# Patient Record
Sex: Female | Born: 1995 | Race: Black or African American | Hispanic: No | Marital: Single | Smoking: Never smoker
Health system: Southern US, Community
[De-identification: ages and names within clinical notes are randomized; demographics above are authoritative.]

## PROBLEM LIST (undated history)

## (undated) DIAGNOSIS — R519 Headache, unspecified: Secondary | ICD-10-CM

## (undated) DIAGNOSIS — R569 Unspecified convulsions: Secondary | ICD-10-CM

## (undated) DIAGNOSIS — R51 Headache: Secondary | ICD-10-CM

## (undated) HISTORY — DX: Headache: R51

## (undated) HISTORY — DX: Headache, unspecified: R51.9

---
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
## 2017-02-05 ENCOUNTER — Ambulatory Visit (INDEPENDENT_AMBULATORY_CARE_PROVIDER_SITE_OTHER): Payer: No Typology Code available for payment source | Admitting: Pediatrics

## 2017-02-19 IMAGING — DX DG FINGER LITTLE 2+V*R*
3 series · 3 of 3 positions shown · non-contrast
Comparison: None.

CLINICAL DATA: 19-year-old female with fall and trauma to the right
fifth digit.

EXAM:
RIGHT LITTLE FINGER 2+V

[finger ap]
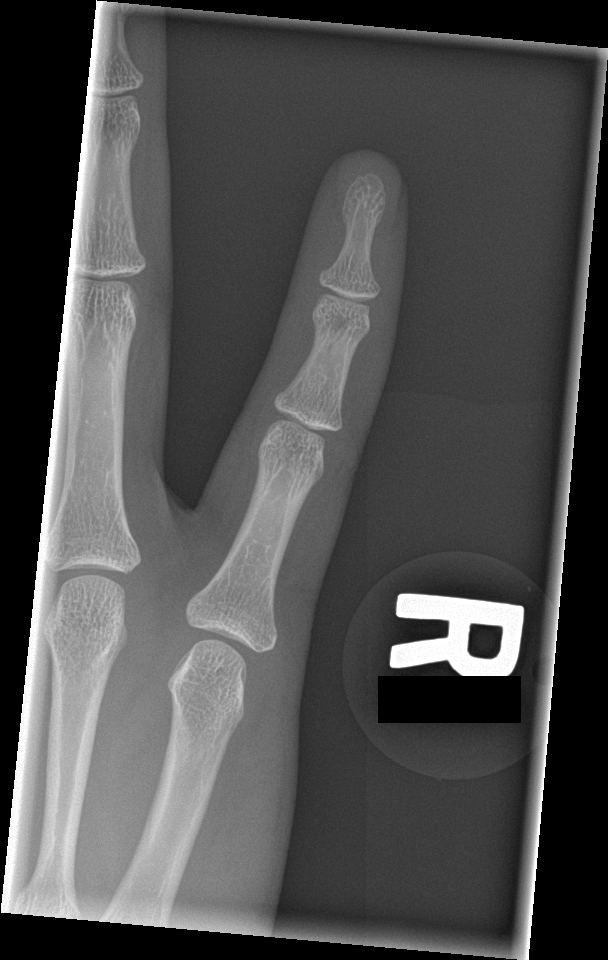

[finger obl]
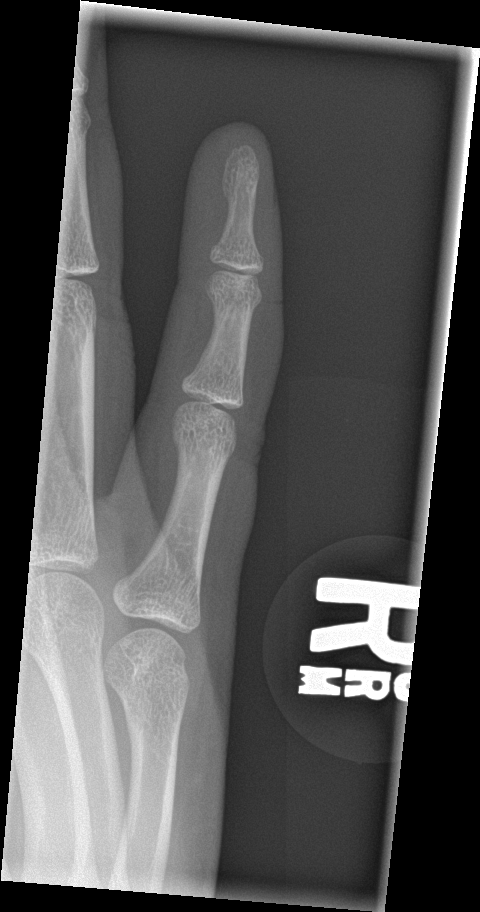

[finger lat]
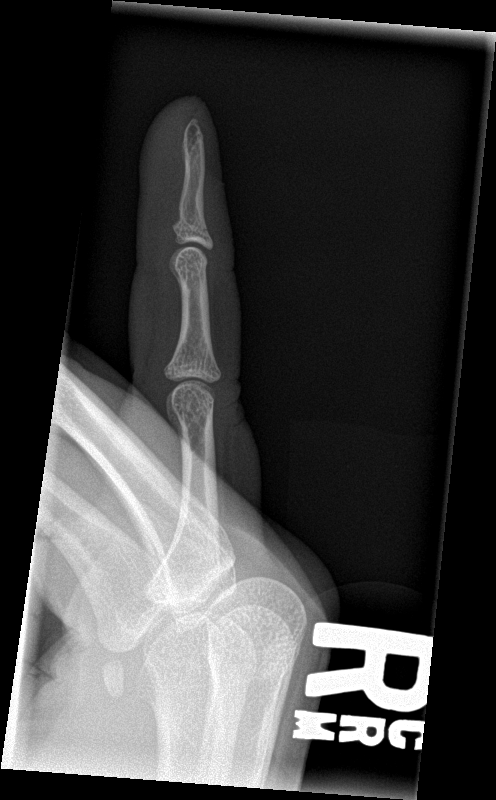

[3 of 3 positions shown; findings below may reference images not displayed]

FINDINGS: There is no acute fracture or dislocation. The bones are well
mineralized. There is mild diffuse soft tissue swelling of the
digit. No radiopaque foreign object.
IMPRESSION: No acute osseous pathology.

## 2017-07-09 ENCOUNTER — Encounter (HOSPITAL_COMMUNITY): Payer: Self-pay | Admitting: Family Medicine

## 2017-07-09 ENCOUNTER — Ambulatory Visit (HOSPITAL_COMMUNITY)
Admission: EM | Admit: 2017-07-09 | Discharge: 2017-07-09 | Disposition: A | Discharge status: Home / Self Care | Payer: 59 | Attending: Family Medicine | Admitting: Family Medicine

## 2017-07-09 DIAGNOSIS — Z820 Family history of epilepsy and other diseases of the nervous system: Secondary | ICD-10-CM | POA: Diagnosis not present

## 2017-07-09 DIAGNOSIS — J029 Acute pharyngitis, unspecified: Secondary | ICD-10-CM

## 2017-07-09 DIAGNOSIS — G43109 Migraine with aura, not intractable, without status migrainosus: Secondary | ICD-10-CM | POA: Insufficient documentation

## 2017-07-09 DIAGNOSIS — G40409 Other generalized epilepsy and epileptic syndromes, not intractable, without status epilepticus: Secondary | ICD-10-CM | POA: Insufficient documentation

## 2017-07-09 DIAGNOSIS — Z8249 Family history of ischemic heart disease and other diseases of the circulatory system: Secondary | ICD-10-CM | POA: Insufficient documentation

## 2017-07-09 DIAGNOSIS — Z888 Allergy status to other drugs, medicaments and biological substances status: Secondary | ICD-10-CM | POA: Insufficient documentation

## 2017-07-09 LAB — POCT RAPID STREP A: Streptococcus, Group A Screen (Direct): NEGATIVE

## 2017-07-09 MED ORDER — IBUPROFEN 800 MG PO TABS
800.0000 mg | ORAL_TABLET | Freq: Once | ORAL | Status: AC
  Administered 2017-07-09: 800 mg via ORAL

## 2017-07-09 MED ORDER — IBUPROFEN 800 MG PO TABS
ORAL_TABLET | ORAL | Status: AC
  Filled 2017-07-09: qty 1

## 2017-07-09 NOTE — Discharge Instructions (Signed)
Push fluids to ensure adequate hydration and keep secretions thin.  Tylenol and/or ibuprofen as needed for pain or fevers.  We have sent your throat swab to be cultured and will call if this returns positive or any changes to treatment are indicated.  If symptoms worsen or do not improve in the next week to return to be seen or to follow up with your PCP.

## 2017-07-09 NOTE — ED Triage Notes (Signed)
Pt here for sore throat x 3 days  

## 2017-07-09 NOTE — ED Provider Notes (Signed)
[facility]    CSN: [id number] Arrival date & time: 07/09/17  1012     History   Chief Complaint Chief Complaint  Patient presents with  . Sore Throat    HPI Olivia Castillo is a 22 y.o. female.   [name] presents with her mother with complaints of sore throat which started three days ago and feels to be worsening. Denies headache, abdominal pain, congestion, ear pain, rash or known ill contacts. Has had slight cough. Took ibuprofen yesterday which did help some. Hx headache and seizures, does not take any medications regularly.   ROS per HPI.      Past Medical History:  Diagnosis Date  . Headache   . Seizure [facility])     Patient Active Problem List   Diagnosis Date Noted  . Intermittent explosive disorder in adult 10/28/2016  . Performance anxiety 10/28/2016  . 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 Localization-related (focal) (partial) epilepsy and epileptic syndromes with complex partial seizures, without mention of intractable epilepsy 10/13/2012  . Episodic tension type headache 10/13/2012  . Migraine without aura 10/13/2012  . Migraine with aura, without mention of intractable migraine without mention of status migrainosus 10/13/2012  . Dysfunctions associated with sleep stages or arousal from sleep 10/13/2012  . Headache(784.0) 10/13/2012    History reviewed. No pertinent surgical history.  OB History   None      Home Medications    Prior to Admission medications   Not on File    Family History Family History  Problem Relation Age of Onset  . Migraines Mother   . Migraines Maternal Grandmother   . Heart disease Maternal Grandfather   . Hypertension Maternal Grandfather   . Hypertension Maternal Aunt   . Migraines Maternal Uncle   . Seizures Other        MGA  . ADD / ADHD Cousin        Maternal 1 st cousin    Social History Social History   Tobacco Use  . Smoking status: Never  Smoker  . Smokeless tobacco: Never Used  Substance Use Topics  . Alcohol use: No  . Drug use: No     Allergies   Oseltamivir phosphate   Review of Systems Review of Systems   Physical Exam Triage Vital Signs ED Triage Vitals [07/09/17 1056]  Enc Vitals Group     BP (!) 105/50     Pulse Rate 98     Resp 18     Temp 99.4 F (37.4 C)     Temp src      SpO2 100 %     Weight      Height      Head Circumference      Peak Flow      Pain Score      Pain Loc      Pain Edu?      Excl. in GC?    No data found.  Updated Vital Signs BP (!) 105/50   Pulse 98   Temp 99.4 F (37.4 C)   Resp 18   LMP 06/26/2017   SpO2 100%    Physical Exam  Constitutional: She is oriented to person, place, and time. She appears well-developed and well-nourished. No distress.  HENT:  Head: Normocephalic and atraumatic.  Right Ear: Tympanic membrane, external ear and ear canal normal.  Left Ear: Tympanic membrane, external ear and ear canal normal.  Nose: Nose normal.  Mouth/Throat: Uvula  is midline, oropharynx is clear and moist and mucous membranes are normal. Tonsils are 1+ on the right. Tonsils are 1+ on the left. Tonsillar exudate.  Eyes: Pupils are equal, round, and reactive to light. Conjunctivae and EOM are normal.  Cardiovascular: Normal rate, regular rhythm and normal heart sounds.  Pulmonary/Chest: Effort normal and breath sounds normal.  Lymphadenopathy:    She has no cervical adenopathy.  Neurological: She is alert and oriented to person, place, and time.  Skin: Skin is warm and dry.     UC Treatments / Results  Labs (all labs ordered are listed, but only abnormal results are displayed) Labs Reviewed  CULTURE, GROUP [facility] STREP [facility])  POCT RAPID STREP A    EKG None  Radiology No results found.  Procedures Procedures (including critical care time)  Medications Ordered in UC Medications  ibuprofen (ADVIL,MOTRIN) tablet 800 mg (has no administration in time  range)    Initial Impression / Assessment and Plan / UC Course  I have reviewed the triage vital signs and the nursing notes.  Pertinent labs & imaging results that were available during my care of the patient were reviewed by me and considered in my medical decision making (see chart for details).     Afebrile. Tonsils with some exudate noted. Negative rapid strep. Likely viral. Throat culture pending. Supportive cares recommended. Patient verbalized understanding and agreeable to plan.    Final Clinical Impressions(s) / UC Diagnoses   Final diagnoses:  Pharyngitis, unspecified etiology     Discharge Instructions     Push fluids to ensure adequate hydration and keep secretions thin.  Tylenol and/or ibuprofen as needed for pain or fevers.  We have sent your throat swab to be cultured and will call if this returns positive or any changes to treatment are indicated.  If symptoms worsen or do not improve in the next week to return to be seen or to follow up with your PCP.     ED Prescriptions    None     Controlled Substance Prescriptions [place] Controlled Substance Registry consulted? Not Applicable   Georgetta Haber, NP 07/09/17 1129

## 2017-07-11 LAB — CULTURE, GROUP A STREP (THRC)

## 2017-10-28 ENCOUNTER — Ambulatory Visit (INDEPENDENT_AMBULATORY_CARE_PROVIDER_SITE_OTHER): Payer: 59 | Admitting: Family

## 2017-10-28 ENCOUNTER — Encounter (INDEPENDENT_AMBULATORY_CARE_PROVIDER_SITE_OTHER): Payer: Self-pay | Admitting: Family

## 2017-10-28 VITALS — BP 100/70 | HR 68 | Ht 63.0 in | Wt 207.8 lb

## 2017-10-28 DIAGNOSIS — G44219 Episodic tension-type headache, not intractable: Secondary | ICD-10-CM | POA: Diagnosis not present

## 2017-10-28 DIAGNOSIS — G40909 Epilepsy, unspecified, not intractable, without status epilepticus: Secondary | ICD-10-CM

## 2017-10-28 DIAGNOSIS — G43009 Migraine without aura, not intractable, without status migrainosus: Secondary | ICD-10-CM | POA: Diagnosis not present

## 2017-10-28 DIAGNOSIS — R569 Unspecified convulsions: Secondary | ICD-10-CM

## 2017-10-28 NOTE — Patient Instructions (Signed)
Thank you for coming in today.   Instructions for you until your next appointment are as follows:  1. I have scheduled a sleep deprived EEG for you for Friday October 30, 2017. Please arrive at [facility] at 7:45AM to check in.  2. To prepare for the sleep deprived EEG, you can nap in the evening until 10pm. Then stay awake for the remainder of the night until your EEG. You can eat and drink caffeine free liquids, watch TV etc but it is important for you to remain awake until the EEG has been done.  3. You will need a driver to take you to and from the EEG appointment.  4. I will call you after I receive the EEG results.  5. It will be a good idea for a monitor to be placed in your room so your family can hear you if you have a seizure during sleep.  6. Do not drive until you have been released to do so by this office.  7. We will make a plan for your follow up based on the EEG results. I will talk with you about that when I call you with the results.  8. Please sign up for MyChart if you have not done so

## 2017-10-28 NOTE — Progress Notes (Signed)
Patient: Olivia Castillo MRN: [id number] Sex: female DOB: 1995/03/06  Provider: Elveria Rising, NP Location of Care: [facility]  Note type: Routine return visit  History of Present Illness: Referral Source: Dr. Roma Schanz History from: mother, patient and [facility] chart Chief Complaint: Headaches  Olivia Castillo is a 22 y.o. young woman with history of headaches and seizures. She was last seen by Dr Sharene Skeans on October 28, 2016. Olivia Castillo and her mother are concerned today because she has had 2 suspected seizure events in the last few months. The first occurred in July while staying overnight with a friend at college. She said that she awakened confused, with urinary incontinence and a bitten tongue. Then next occurred last weekend, and she awakened in a similar fashion, confused, with urinary incontinence and bitten tongue as well as scratches and bruising to her left upper arm, bruising to the right arm and very sore legs. She and her mother say that she had a normal day prior to these events and that she had not been sleep deprived. They also report some events in which Olivia Castillo would go to her mother in the mornings with large bruises on her arms and legs with no memory of how they occurred. Olivia Castillo had seizures that began in 2012 and she successfully tapered off Lamotrigine in 2014.  Olivia Castillo reports intermittent headaches, some of which are severe. She estimates one headache per week, but says that not all require treatment. When she has a bad headache, Ibuprofen 600mg  and a nap usually gives her relief. She denies any symptoms with the headaches other than holocephalic pain.   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 She and her mother tell me today that she is taking time off from school and working as a Geologist, engineering in a store. She usually works evenings but does not work overnight. Olivia Castillo denies skipped meals  and says that she has been working on a healthy eating plan and exercise program. She says that she drinks plenty of water as part of that plan.   Olivia Castillo has been otherwise healthy since she was last seen. Neither she nor her mother have other health concerns for her today other than previously mentioned.  Review of Systems: Please see the HPI for neurologic and other pertinent review of systems. Otherwise, all other systems were reviewed and were negative.    Past Medical History:  Diagnosis Date  . Headache   . Seizure (HCC)    Hospitalizations: No., Head Injury: No., Nervous System Infections: No., Immunizations up to date: Yes.   Past Medical History Comments: The patient was evaluated October 09, 2005 for headaches and insomnia. I concluded that she had migraine without aura and tension-type headaches. 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 She went to bed quite late and slept soundly for long periods of time. I recommended melatonin and possibly clonidine as well assleep hygiene.  I saw her again January 29, 2009 with history of headaches twice a week. She came home early on 2 occasions. I noted positive family history of migraines, a normal neurological examination, and a diagnosis of migraine without aura, migraine with aura and episodic tension type headaches. Mother mentioned that the patient had headaches and lost vision at times when her hair was being combed, often without changing position or elevating her head. These episodes were not associated with syncope because she did not  lose consciousness nor fall.  She had onset of nocturnal seizures in February 10, 2011. This may have been preceded by an episode of twisting of her mouth associated clot laughter during an episode of flu in late November, 2012. CT scan of the brain February 05, 2011 was normal without contrast. EEG February 11, 2011 was normal with the patient awake and drowsy.  Routine  EEG in December 2013 was normal.  Patient was hospitalized due to seizure activity in 2013.  A 48-hour ambulatory EEG showed 12 electrographic seizures lasting 45 to 102 seconds with a central 6 hertz theta range activity that localized to either side with secondary generalization and a generalized discharge to 20 hertz beta range activity shifting to 6 hertz generalized theta range activity followed by delta. Shifting frequencies convinced me that this was seizure activity as opposed to a rhythmic mid-temporal discharge.   She had problems with bedwetting between ages 9 and 59.  Birth History 7 lbs. 3 oz. infant born at term to a 22 year old gravida 3 para 2000 woman. Gestation was complicated by spotting mother recalls having "some heart trouble". She can't recall the details. She had excessive nausea during the first trimester and took an antiemetic. Labor lasted for 23 hours mother received epidural anesthesia. Normal spontaneous vaginal delivery. Delivery room was uncomplicated except for suctioning within her mouth. She did not require resuscitation.  Nursery course was uneventful. She went home with her mother.  Growth and development is recorded on the chart is normal.  Behavior History intermittent explosive anger, performance anxiety; as a child she would become upset easily which continued into her teen years  Surgical History History reviewed. No pertinent surgical history.  Family History family history includes ADD / ADHD in her cousin; Heart disease in her maternal grandfather; Hypertension in her maternal aunt and maternal grandfather; Migraines in her maternal grandmother, maternal uncle, and mother; Seizures in her other. Family History is otherwise negative for migraines, seizures, cognitive impairment, blindness, deafness, birth defects, chromosomal disorder, autism.  Social History Social History   Socioeconomic History  . Marital status: Single    Spouse name:  Not on file  . Number of children: Not on file  . Years of education: Not on file  . Highest education level: Not on file  Occupational History  . Not on file  Social Needs  . Financial resource strain: Not on file  . Food insecurity:    Worry: Not on file    Inability: Not on file  . Transportation needs:    Medical: Not on file    Non-medical: Not on file  Tobacco Use  . Smoking status: Never Smoker  . Smokeless tobacco: Never Used  Substance and Sexual Activity  . Alcohol use: No  . Drug use: No  . Sexual activity: Never  Lifestyle  . Physical activity:    Days per week: Not on file    Minutes per session: Not on file  . Stress: Not on file  Relationships  . Social connections:    Talks on phone: Not on file    Gets together: Not on file    Attends religious service: Not on file    Active member of club or organization: Not on file    Attends meetings of clubs or organizations: Not on file    Relationship status: Not on file  Other Topics Concern  . 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  She attended [employer].   She lives with both parents. She has two older sisters.   She enjoys chilling, shopping, and hanging with friends.   She works at [employer] in [employer].    Allergies Allergies  Allergen Reactions  . Oseltamivir Phosphate Other (See Comments)    Family relates connection between tamiflu and seizures    Physical Exam BP 100/70   Pulse 68   Ht 5\' 3"  (1.6 m)   Wt 207 lb 12.8 oz (94.3 kg)   BMI 36.81 kg/m  General: Well developed, well nourished, seated, in no evident distress, black hair, brown eyes, right handed Head: Head normocephalic and atraumatic.  Oropharynx benign. Neck: Supple with no carotid bruits Cardiovascular: Regular rate and rhythm, no murmurs Respiratory: Breath sounds clear to auscultation Musculoskeletal: No obvious deformities or scoliosis Skin: No rashes or neurocutaneous  lesions  Neurologic Exam Mental Status: Awake and fully alert.  Oriented to place and time.  Recent and remote memory intact.  Attention span, concentration, and fund of knowledge appropriate.  Mood and affect appropriate. Cranial Nerves: Fundoscopic exam reveals sharp disc margins.  Pupils equal, briskly reactive to light.  Extraocular movements full without nystagmus.  Visual fields full to confrontation.  Hearing intact and symmetric to finger rub.  Facial sensation intact.  Face tongue, palate move normally and symmetrically.  Neck flexion and extension normal. Motor: Normal bulk and tone. Normal strength in all tested extremity muscles. Sensory: Intact to touch and temperature in all extremities.  Coordination: Rapid alternating movements normal in all extremities.  Finger-to-nose and heel-to shin performed accurately bilaterally.  Romberg negative. Gait and Station: Arises from chair without difficulty.  Stance is normal. Gait demonstrates normal stride length and balance.   Able to heel, toe and tandem walk without difficulty. Reflexes: 1+ and symmetric. Toes downgoing.  Impression 1. Nocturnal seizures 2. Migraine without aura 3. Episodic tension headache   Recommendations for plan of care The patient's previous [facility] records were reviewed. Olivia Castillo has neither had nor required imaging or lab studies since the last visit. She is a 23 year old young woman with history of headaches and seizures. She successfully tapered off Lamotrigine in 2014, after a diagnosis of seizures in 2012. Unfortunately she had an event in July and one last weekend that are suspicious for seizures, as well as some times of waking with unexplained bruising. I recommended a sleep deprived EEG, which will be performed on October 30, 2017. I reviewed instructions for the sleep deprived EEG with Farin and her mother. I will call Tahiry and her mother after the results are available and will talk with them about a  treatment plan at that time. I instructed her not to drive until released to do so by this office. I recommended use of a monitor in her room so that her family will be able to hear her if a seizure occurs. Dacia and her mother agreed with the plans made today.   The medication list was reviewed and reconciled.  No changes were made in the prescribed medications today.  A complete medication list was provided to the patient.  Allergies as of 10/28/2017      Reactions   Oseltamivir Phosphate Other (See Comments)   Family relates connection between tamiflu and seizures      Medication List    as of 10/28/2017  2:45 PM   You have not been prescribed any medications.     Dr. Sharene Skeans was consulted regarding  the patient.   Total time spent with the patient was 25 minutes, of which 50% or more was spent in counseling and coordination of care.   Elveria Rising NP-C

## 2017-10-30 ENCOUNTER — Ambulatory Visit (HOSPITAL_COMMUNITY)
Admission: RE | Admit: 2017-10-30 | Discharge: 2017-10-30 | Disposition: A | Discharge status: Home / Self Care | Payer: 59 | Source: Ambulatory Visit | Attending: Family | Admitting: Family

## 2017-10-30 ENCOUNTER — Telehealth (INDEPENDENT_AMBULATORY_CARE_PROVIDER_SITE_OTHER): Payer: Self-pay | Admitting: Family

## 2017-10-30 DIAGNOSIS — R569 Unspecified convulsions: Secondary | ICD-10-CM

## 2017-10-30 DIAGNOSIS — Z79899 Other long term (current) drug therapy: Secondary | ICD-10-CM

## 2017-10-30 DIAGNOSIS — G40909 Epilepsy, unspecified, not intractable, without status epilepticus: Secondary | ICD-10-CM | POA: Diagnosis present

## 2017-10-30 NOTE — Progress Notes (Signed)
EEG complete. Results pending.  ?

## 2017-10-30 NOTE — Telephone Encounter (Signed)
I called the patient and reached an unidentified voicemail. I left her a message saying that I will call her on Monday. At that time I will review the EEG results and Dr Hickling's recommendation for her to get labs drawn and start Lamotrigine. TG

## 2017-10-31 NOTE — Procedures (Signed)
Patient: Olivia Castillo MRN: [id number] Sex: female DOB: [birth year]  Clinical History: Olivia Castillo is a 22 y.o. with recurrent nocturnal seizures.  These began December, 2012.  Initial EEG is in December 2012 and December 2013 were normal for the patient was hospitalized related to seizure activity.  Prolonged end of her EEG showed 12 electrographic seizures lasting 45 to 102 seconds.  She was placed on lamotrigine which controlled seizure behavior and it was discontinued in 2014.  She had recurrent seizures in July and late August associated with urinary incontinence and tongue biting.  The study is performed to look for the presence of seizure activity.  Medications: none  Procedure: The tracing is carried out on a 32-channel digital Cadwell recorder, reformatted into 16-channel montages with 1 devoted to EKG.  The patient was awake, drowsy and asleep during the recording.  The international 10/20 system lead placement used.  Recording time 45.4 minutes.   Description of Findings: Dominant frequency is 20 V, 10 hz, alpha range activity that is well modulated and well regulated that was broadly and symmetrically distributed and attenuates with eye-opening.    Background activity consists of mixed frequency alpha and beta range activity with occasional upper theta range component superimposed and a well-defined 8 Hz central rhythm.  Frontal eye movement artifact was seen.  Patient becomes drowsy and drifts into natural sleep with generalized delta range activity vertex sharp waves and symmetric and synchronous sleep spindles.  There was no interictal epileptiform activity in the form of spikes or sharp waves..  Activating procedures included intermittent photic stimulation, and hyperventilation.  Intermittent photic stimulation failed to induce a driving response.  Hyperventilation caused no significant change in background.  EKG showed a regular sinus rhythm with a ventricular response of 96  beats per minute.  Impression: This is a normal record with the patient awake, drowsy and asleep.  A normal EEG does not rule out the presence of seizures.  Ellison Carwin, MD

## 2017-11-02 ENCOUNTER — Other Ambulatory Visit (INDEPENDENT_AMBULATORY_CARE_PROVIDER_SITE_OTHER): Payer: Self-pay | Admitting: Family

## 2017-11-02 ENCOUNTER — Telehealth (INDEPENDENT_AMBULATORY_CARE_PROVIDER_SITE_OTHER): Payer: Self-pay | Admitting: Family

## 2017-11-02 DIAGNOSIS — R569 Unspecified convulsions: Secondary | ICD-10-CM

## 2017-11-02 LAB — CBC WITH DIFFERENTIAL/PLATELET
BASOS PCT: 1 %
Basophils Absolute: 42 cells/uL (ref 0–200)
EOS PCT: 1 %
Eosinophils Absolute: 42 cells/uL (ref 15–500)
HCT: 33.1 % — ABNORMAL LOW (ref 35.0–45.0)
Hemoglobin: 10 g/dL — ABNORMAL LOW (ref 11.7–15.5)
Lymphs Abs: 1760 cells/uL (ref 850–3900)
MCH: 22 pg — ABNORMAL LOW (ref 27.0–33.0)
MCHC: 30.2 g/dL — ABNORMAL LOW (ref 32.0–36.0)
MCV: 72.9 fL — ABNORMAL LOW (ref 80.0–100.0)
MONOS PCT: 9.5 %
MPV: 9.7 fL (ref 7.5–12.5)
NEUTROS PCT: 46.6 %
Neutro Abs: 1957 cells/uL (ref 1500–7800)
PLATELETS: 426 10*3/uL — AB (ref 140–400)
RBC: 4.54 10*6/uL (ref 3.80–5.10)
RDW: 14.7 % (ref 11.0–15.0)
TOTAL LYMPHOCYTE: 41.9 %
WBC: 4.2 10*3/uL (ref 3.8–10.8)
WBCMIX: 399 {cells}/uL (ref 200–950)

## 2017-11-02 LAB — ALT: ALT: 12 U/L (ref 5–32)

## 2017-11-02 MED ORDER — LAMOTRIGINE 25 MG PO TABS: ORAL_TABLET | ORAL | 0 refills | Status: DC

## 2017-11-02 NOTE — Telephone Encounter (Signed)
°  Who's calling (name and relationship to patient) : Trudah, mother (DPR on file)  Best contact number: [phone]  Provider they see: Elveria Rising, NP  Reason for call: Stated they picked up the lamotrigine rx today from [facility] on [address] Would like to know why the amount was written for 84 pills.     PRESCRIPTION REFILL ONLY  Name of prescription:  Pharmacy:

## 2017-11-02 NOTE — Telephone Encounter (Signed)
I called and talked with Olivia Castillo's mother. I explained to her that the EEG was read as normal but because of her recent seziures, Dr Sharene Skeans recommends starting Lamotrigine as she had seizure control with it in the past. I also explained that she will need a blood draw to check CBC and ALT prior to starting the medication, in 2 weeks, 4 weeks and 6 weeks. I sent in the order for the first 4 weeks of medication and the lab order. I will mail a letter to the patient with this information. I will see Olivia Castillo again in 6 weeks. Mom agreed with the plans made today. TG

## 2017-11-02 NOTE — Telephone Encounter (Signed)
I called and left a message to explain that the 84 tablets is for the first 4 weeks of therapy. On week #5, I will send in a new Rx for 100mg  tablets. I will call Mom again later to talk with her. TG

## 2017-11-02 NOTE — Telephone Encounter (Signed)
Did you want to change this before I send in the refill

## 2017-11-02 NOTE — Telephone Encounter (Signed)
I called and explained to Mom about the medication. She said that when Olivia Castillo took it before, that she did not increase past 50mg  twice per day and did well. I told Mom that may happen again this time but that we will need to wait and see. I will refill her medication as needed when I call her later on about lab results. Mom agreed with this plan. TG

## 2017-11-03 ENCOUNTER — Other Ambulatory Visit (INDEPENDENT_AMBULATORY_CARE_PROVIDER_SITE_OTHER): Payer: Self-pay | Admitting: Family

## 2017-11-03 DIAGNOSIS — R569 Unspecified convulsions: Secondary | ICD-10-CM

## 2017-11-03 DIAGNOSIS — D649 Anemia, unspecified: Secondary | ICD-10-CM

## 2017-11-03 DIAGNOSIS — Z79899 Other long term (current) drug therapy: Secondary | ICD-10-CM

## 2017-11-03 NOTE — Telephone Encounter (Signed)
I agree with your assessment and plan 

## 2017-11-04 ENCOUNTER — Other Ambulatory Visit (INDEPENDENT_AMBULATORY_CARE_PROVIDER_SITE_OTHER): Payer: Self-pay | Admitting: Family

## 2017-11-04 DIAGNOSIS — R569 Unspecified convulsions: Secondary | ICD-10-CM

## 2017-11-27 LAB — CBC WITH DIFFERENTIAL/PLATELET
BASOS PCT: 1 %
Basophils Absolute: 40 cells/uL (ref 0–200)
Eosinophils Absolute: 20 cells/uL (ref 15–500)
Eosinophils Relative: 0.5 %
HEMATOCRIT: 34.7 % — AB (ref 35.0–45.0)
Hemoglobin: 10.9 g/dL — ABNORMAL LOW (ref 11.7–15.5)
LYMPHS ABS: 1500 {cells}/uL (ref 850–3900)
MCH: 23.1 pg — ABNORMAL LOW (ref 27.0–33.0)
MCHC: 31.4 g/dL — ABNORMAL LOW (ref 32.0–36.0)
MCV: 73.7 fL — AB (ref 80.0–100.0)
MPV: 10.1 fL (ref 7.5–12.5)
Monocytes Relative: 9.1 %
Neutro Abs: 2076 cells/uL (ref 1500–7800)
Neutrophils Relative %: 51.9 %
Platelets: 454 10*3/uL — ABNORMAL HIGH (ref 140–400)
RBC: 4.71 10*6/uL (ref 3.80–5.10)
RDW: 16 % — ABNORMAL HIGH (ref 11.0–15.0)
Total Lymphocyte: 37.5 %
WBC mixed population: 364 cells/uL (ref 200–950)
WBC: 4 10*3/uL (ref 3.8–10.8)

## 2017-11-27 LAB — ALT: ALT: 13 U/L (ref 5–32)

## 2017-11-30 ENCOUNTER — Ambulatory Visit (INDEPENDENT_AMBULATORY_CARE_PROVIDER_SITE_OTHER): Payer: 59 | Admitting: Family

## 2017-11-30 ENCOUNTER — Telehealth (INDEPENDENT_AMBULATORY_CARE_PROVIDER_SITE_OTHER): Payer: Self-pay | Admitting: Family

## 2017-11-30 ENCOUNTER — Encounter (INDEPENDENT_AMBULATORY_CARE_PROVIDER_SITE_OTHER): Payer: Self-pay | Admitting: Family

## 2017-11-30 VITALS — BP 90/64 | HR 76 | Ht 63.5 in | Wt 211.4 lb

## 2017-11-30 DIAGNOSIS — Z79899 Other long term (current) drug therapy: Secondary | ICD-10-CM | POA: Diagnosis not present

## 2017-11-30 DIAGNOSIS — R569 Unspecified convulsions: Secondary | ICD-10-CM | POA: Diagnosis not present

## 2017-11-30 MED ORDER — LAMOTRIGINE 25 MG PO TABS: ORAL_TABLET | ORAL | 5 refills | Status: DC

## 2017-11-30 NOTE — Telephone Encounter (Signed)
This is the current pharmacy listed

## 2017-11-30 NOTE — Telephone Encounter (Signed)
°  Who(name and relationship to patient) : Trudah (Mother) Provider they see: Inetta Fermo  Reason for call: Mom wanted to make sure pt's pharmacy was switched to the [facility] on [address]

## 2017-11-30 NOTE — Progress Notes (Signed)
Patient: Olivia Castillo MRN: [id number] Sex: female DOB: 1996-01-22  Provider: Elveria Rising, NP Location of Care: [facility]  Note type: Routine return visit  History of Present Illness: Referral Source: Dr. Roma Schanz History from: mother, patient and [facility] chart Chief Complaint: Headaches  Olivia Castillo is a 22 y.o. girl with history of headaches and seizures. She was last seen October 28, 2017. At that time [name] had experienced two nocturnal seizures. An EEG was performed on S,eptember 6, 2019 and was normal. Because of the history of events and her history of seizures in the past, it was recommended that Aviendha restart Lamotrigine. She has done so and has tolerated it well. She has remained seizure free. Lab studies were obtained that revealed an iron deficiency anemia. Aunya has been taking iron supplements for that.   [name]'s mother says when she took Lamotrigine in the past that she had seizure freedom on 50mg  twice per day. She does not want [name] to take the planned 100mg  twice per day as recommneded.   [name] has been doing well since her last visit. She has history of tension and migraine headaches but says that they have not been problematic since she was last seen. [name] been restricted from driving and has not drive since her last visit. She has been otherwise healthy and neither she nor her mother have other health concerns for her today other than previously mentioned.  Review of Systems: Please see the HPI for neurologic and other pertinent review of systems. Otherwise, all other systems were reviewed and were negative.    Past Medical History:  Diagnosis Date  . Headache   . Seizure (HCC)    Hospitalizations: No., Head Injury: No., Nervous System Infections: No., Immunizations up to date: Yes.   Past Medical History Comments:  The patient was evaluated October 09, 2005 for headaches and insomnia. I concluded that she had  migraine without aura and tension-type headaches. 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 She went to bed quite late and slept soundly for long periods of time. I recommended melatonin and possibly clonidine as well assleep hygiene.  I saw her again January 29, 2009 with history of headaches twice a week. She came home early on 2 occasions. I noted positive family history of migraines, a normal neurological examination, and a diagnosis of migraine without aura, migraine with aura and episodic tension type headaches. Mother mentioned that the patient had headaches and lost vision at times when her hair was being combed, often without changing position or elevating her head. These episodes were not associated with syncope because she did not lose consciousness nor fall.  She had onset of nocturnal seizures in February 10, 2011. This may have been preceded by an episode of twisting of her mouth associated clot laughter during an episode of flu in late November, 2012. CT scan of the brain February 05, 2011 was normal without contrast. EEG February 11, 2011 was normal with the patient awake and drowsy.  Routine EEG in December 2013 was normal.Patient was hospitalized due to seizure activity in 2013.  A 48-hour ambulatory EEG showed 12 electrographic seizures lasting 45 to 102 seconds with a central 6 hertz theta range activity that localized to either side with secondary generalization and a generalized discharge to 20 hertz beta range activity shifting to 6 hertz generalized theta range activity followed by delta. Shifting frequencies convinced me that this was seizure activity as opposed to  a rhythmic mid-temporal discharge.  She tapered off Lamotrigine in 2014.  She had problems with bedwetting between ages 49 and 25.  Birth History 7 lbs. 3 oz. infant born at term to a 31 year old gravida 3 para 2002 woman. Gestation was complicated by spotting mother recalls  having "some heart trouble". She can't recall the details. She had excessive nausea during the first trimester and took an antiemetic. Labor lasted for 23 hours mother received epidural anesthesia. Normal spontaneous vaginal delivery. Delivery room was uncomplicated except for suctioning within her mouth. She did not require resuscitation.  Nursery course was uneventful. She went home with her mother.  Growth and development is recorded on the chart is normal.  Behavior History intermittent explosive anger, performance anxiety;as a child she would become upset easily which continued into her teen years   Surgical History History reviewed. No pertinent surgical history.  Family History family history includes ADD / ADHD in her cousin; Heart disease in her maternal grandfather; Hypertension in her maternal aunt and maternal grandfather; Migraines in her maternal grandmother, maternal uncle, and mother; Seizures in her other. Family History is otherwise negative for migraines, seizures, cognitive impairment, blindness, deafness, birth defects, chromosomal disorder, autism.  Social History Social History   Socioeconomic History  . Marital status: Single    Spouse name: Not on file  . Number of children: Not on file  . Years of education: Not on file  . Highest education level: Not on file  Occupational History  . Not on file  Social Needs  . Financial resource strain: Not on file  . Food insecurity:    Worry: Not on file    Inability: Not on file  . Transportation needs:    Medical: Not on file    Non-medical: Not on file  Tobacco Use  . Smoking status: Never Smoker  . Smokeless tobacco: Never Used  Substance and Sexual Activity  . Alcohol use: No  . Drug use: No  . Sexual activity: Never  Lifestyle  . Physical activity:    Days per week: Not on file    Minutes per session: Not on file  . Stress: Not on file  Relationships  . Social connections:    Talks on phone:  Not on file    Gets together: Not on file    Attends religious service: Not on file    Active member of club or organization: Not on file    Attends meetings of clubs or organizations: Not on file    Relationship status: Not on file  Other Topics Concern  . 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   She attended [employer].   She lives with both parents. She has two older sisters.   She enjoys chilling, shopping, and hanging with friends.   She works at [employer] in [employer].    Allergies Allergies  Allergen Reactions  . Oseltamivir Phosphate Other (See Comments)    Family relates connection between tamiflu and seizures    Physical Exam BP 90/64   Pulse 76   Ht 5' 3.5" (1.613 m)   Wt 211 lb 6.4 oz (95.9 kg)   BMI 36.86 kg/m  General: Well developed, well nourished obese young woman, seated on exam table, in no evident distress, black hair, brown eyes, right handed Head: Head normocephalic and atraumatic.  Oropharynx benign. Neck: Supple with no carotid bruits Cardiovascular: Regular rate and rhythm, no murmurs Respiratory: Breath  sounds clear to auscultation Musculoskeletal: No obvious deformities or scoliosis Skin: No rashes or neurocutaneous lesions  Neurologic Exam Mental Status: Awake and fully alert.  Oriented to place and time.  Recent and remote memory intact.  Attention span, concentration, and fund of knowledge appropriate.  Mood and affect appropriate. Cranial Nerves: Fundoscopic exam reveals sharp disc margins.  Pupils equal, briskly reactive to light.  Extraocular movements full without nystagmus.  Visual fields full to confrontation.  Hearing intact and symmetric to finger rub.  Facial sensation intact.  Face tongue, palate move normally and symmetrically.  Neck flexion and extension normal. Motor: Normal bulk and tone. Normal strength in all tested extremity muscles. Sensory: Intact to touch and temperature in all  extremities.  Coordination: Rapid alternating movements normal in all extremities.  Finger-to-nose and heel-to shin performed accurately bilaterally.  Romberg negative. Gait and Station: Arises from chair without difficulty.  Stance is normal. Gait demonstrates normal stride length and balance.   Able to heel, toe and tandem walk without difficulty. Reflexes: 1+ and symmetric. Toes downgoing.  Impression 1. Nocturnal seizures 2.  Migraine without aura 3.  Episodic tension headache   Recommendations for plan of care The patient's previous [facility] records were reviewed. Akshaya has neither had nor required imaging or lab studies since the last visit. She is a 22 year old girl with history of nocturnal seizures, tension and migraine headaches. She is taking and tolerating Lamotrigine and has remained seizure free since starting the medication a month ago. I talked with Abisola and her mother and told them that she needs to have a blood test again in 2 weeks. This one will include the Lamotrigine level and I explained how to get that done as a trough level. I will call her when the result is available to me. We will also talk about driving at that time. I instructed Abbiegail to continue to take iron supplements for now. will see her back in follow up in 2 months or sooner if needed. Tatayana and her mother agreed with the plans made today.   The medication list was reviewed and reconciled.  No changes were made in the prescribed medications today.  A complete medication list was provided to the patient.  Allergies as of 11/30/2017      Reactions   Oseltamivir Phosphate Other (See Comments)   Family relates connection between tamiflu and seizures      Medication List        Accurate as of 11/30/17 11:59 PM. Always use your most recent med list.          lamoTRIgine 25 MG tablet Commonly known as:  LAMICTAL Take 2 tablets twice per day       Dr. Sharene Skeans was consulted regarding the patient.     Total time spent with the patient was 25 minutes, of which 50% or more was spent in counseling and coordination of care.   Elveria Rising NP-C

## 2017-11-30 NOTE — Patient Instructions (Signed)
Thank you for coming in today.   Instructions for you until your next appointment are as follows: 1. Continue taking your medication as you have been doing 2. You will need to have a blood test done at the end of next week. With this blood test, have the blood drawn in the morning, before the first dose of medication of the day. Be sure to take your medicine after the blood has been drawn.  3. I will call you when I receive the blood test report 4. Continue taking your iron as you have been doing.  5. We will talk about driving after we receive the blood test with the medicine level 6. Please plan to return for follow up in 2 months or sooner if needed.

## 2017-12-01 ENCOUNTER — Encounter (INDEPENDENT_AMBULATORY_CARE_PROVIDER_SITE_OTHER): Payer: Self-pay | Admitting: Family

## 2017-12-29 ENCOUNTER — Telehealth (INDEPENDENT_AMBULATORY_CARE_PROVIDER_SITE_OTHER): Payer: Self-pay | Admitting: Family

## 2017-12-29 NOTE — Telephone Encounter (Signed)
°  Who's calling (name and relationship to patient) : Trudah (Mother) Best contact number: [phone] Provider they see: Inetta Fermo  Reason for call: Mom called and stated that she lost the paper work with the lab requisition and the instructions. Mom would like to know if the paper can be mailed to her, and she would also like a return call to discuss when pt needs to have her labs drawn. Please advise.

## 2017-12-29 NOTE — Telephone Encounter (Signed)
Spoke with mom about her phone message. I informed her that I reprinted the requisition forms and will place them in the mail. I also informed her that the patient needs to get these labs done anytime before her next appointment. She understood

## 2018-02-01 ENCOUNTER — Ambulatory Visit (INDEPENDENT_AMBULATORY_CARE_PROVIDER_SITE_OTHER): Payer: 59 | Admitting: Family

## 2018-02-01 ENCOUNTER — Encounter (INDEPENDENT_AMBULATORY_CARE_PROVIDER_SITE_OTHER): Payer: Self-pay | Admitting: Family

## 2018-02-01 VITALS — BP 120/80 | HR 76 | Ht 63.5 in | Wt 206.8 lb

## 2018-02-01 DIAGNOSIS — G40209 Localization-related (focal) (partial) symptomatic epilepsy and epileptic syndromes with complex partial seizures, not intractable, without status epilepticus: Secondary | ICD-10-CM

## 2018-02-01 DIAGNOSIS — G44219 Episodic tension-type headache, not intractable: Secondary | ICD-10-CM

## 2018-02-01 DIAGNOSIS — G43009 Migraine without aura, not intractable, without status migrainosus: Secondary | ICD-10-CM

## 2018-02-01 DIAGNOSIS — D509 Iron deficiency anemia, unspecified: Secondary | ICD-10-CM | POA: Insufficient documentation

## 2018-02-01 DIAGNOSIS — R569 Unspecified convulsions: Secondary | ICD-10-CM | POA: Diagnosis not present

## 2018-02-01 DIAGNOSIS — D508 Other iron deficiency anemias: Secondary | ICD-10-CM

## 2018-02-01 LAB — CBC WITH DIFFERENTIAL/PLATELET
BASOS PCT: 0.7 %
Basophils Absolute: 39 cells/uL (ref 0–200)
EOS ABS: 39 {cells}/uL (ref 15–500)
Eosinophils Relative: 0.7 %
HCT: 35.9 % (ref 35.0–45.0)
HEMOGLOBIN: 11.6 g/dL — AB (ref 11.7–15.5)
Lymphs Abs: 1348 cells/uL (ref 850–3900)
MCH: 24.8 pg — AB (ref 27.0–33.0)
MCHC: 32.3 g/dL (ref 32.0–36.0)
MCV: 76.7 fL — ABNORMAL LOW (ref 80.0–100.0)
MONOS PCT: 10.6 %
MPV: 9.7 fL (ref 7.5–12.5)
Neutro Abs: 3493 cells/uL (ref 1500–7800)
Neutrophils Relative %: 63.5 %
Platelets: 404 10*3/uL — ABNORMAL HIGH (ref 140–400)
RBC: 4.68 10*6/uL (ref 3.80–5.10)
RDW: 16.2 % — ABNORMAL HIGH (ref 11.0–15.0)
Total Lymphocyte: 24.5 %
WBC mixed population: 583 cells/uL (ref 200–950)
WBC: 5.5 10*3/uL (ref 3.8–10.8)

## 2018-02-01 LAB — ALT: ALT: 10 U/L (ref 6–29)

## 2018-02-01 LAB — LAMOTRIGINE LEVEL: Lamotrigine Lvl: 4.4 ug/mL (ref 4.0–18.0)

## 2018-02-01 MED ORDER — LAMOTRIGINE 25 MG PO TABS: ORAL_TABLET | ORAL | 3 refills | Status: DC

## 2018-02-01 NOTE — Progress Notes (Signed)
Patient: Olivia Castillo MRN: [id number] Sex: female DOB: [birth year]  Provider: Elveria Risingina Travonna Swindle, NP [facility]  Note type: Routine return visit  History of Present Illness: Referral Source: Dr. Roma Schanzhristopher Castillo History from: mother, patient and [facility] chart Chief Complaint: Headaches  Olivia Castillo is a 22 y.o. young woman with history of headaches and seizures. She was last seen November 30, 2017.  Olivia Castillo is taking and tolerating Lamotrigine for 2 nocturnal seizures that occurred this summer. She has had no further seizures since starting the medication. Olivia Castillo was found to have an iron deficiency anemia with surveillance lab studies done for Lamotrigine and that has improved with taking iron supplements. She had blood drawn on January 29, 2018 that revealed a Hemoglobin of 11.6 and Hematocrit of 35.9%. The Lamotrigine level was 4.554mcg/ml. Olivia Castillo reports today that she has been doing well since her last visit, except for some reason sinus/allergy congestion. She works part time and is taking a break from school this semester. Olivia Castillo has history of headaches but says that they have not been problematic since her last visit. She has been otherwise generally healthy and neither she nor mother have other health concerns for Olivia Castillo today other than previously mentioned.  Review of Systems: Please see the HPI for neurologic and other pertinent review of systems. Otherwise, all other systems were reviewed and were negative.    Past Medical History:  Diagnosis Date  . Headache   . Seizure (HCC)    Hospitalizations: No., Head Injury: No., Nervous System Infections: No., Immunizations up to date: Yes.   Past Medical History Comments: See HPI Copied from previous record: patient was evaluated October 09, 2005 for headaches and insomnia. I concluded that she had migraine without aura and tension-type headaches. She also had a phase shift circadian rhythm  problem and insomnia. She went to bed quite late and slept soundly for long periods of time. I recommended melatonin and possibly clonidine as well assleep hygiene.  I saw her again January 29, 2009 with history of headaches twice a week. She came home early on 2 occasions. I noted positive family history of migraines, a normal neurological examination, and a diagnosis of migraine without aura, migraine with aura and episodic tension type headaches. Mother mentioned that the patient had headaches and lost vision at times when her hair was being combed, often without changing position or elevating her head. These episodes were not associated with syncope because she did not lose consciousness nor fall.  She had onset of nocturnal seizures in February 10, 2011. This may have been preceded by an episode of twisting of her mouth associated clot laughter during an episode of flu in late November, 2012. CT scan of the brain February 05, 2011 was normal without contrast. EEG February 11, 2011 was normal with the patient awake and drowsy.  Routine EEG in December 2013 was normal.Patient was hospitalized due to seizure activity in 2013.  A 48-hour ambulatory EEG showed 12 electrographic seizures lasting 45 to 102 seconds with a central 6 hertz theta range activity that localized to either side with secondary generalization and a generalized discharge to 20 hertz beta range activity shifting to 6 hertz generalized theta range activity followed by delta. Shifting frequencies convinced me that this was seizure activity as opposed to a rhythmic mid-temporal discharge.  She tapered off Lamotrigine in 2014.  She had problems with bedwetting between ages 89 and 89.  Birth History 7 lbs. 3 oz.  infant born at term to a 70 year old gravida 3 para 2002 woman. Gestation was complicated by spotting mother recalls having "some heart trouble". She can't recall the details. She had excessive nausea during the  first trimester and took an antiemetic. Labor lasted for 23 hours mother received epidural anesthesia. Normal spontaneous vaginal delivery. Delivery room was uncomplicated except for suctioning within her mouth. She did not require resuscitation.  Nursery course was uneventful. She went home with her mother.  Growth and development is recorded on the chart is normal.  Behavior History intermittent explosive anger, performance anxiety;as a child she would become upset easily which continued into her teen years  Surgical History History reviewed. No pertinent surgical history.  Family History family history includes ADD / ADHD in her cousin; Heart disease in her maternal grandfather; Hypertension in her maternal aunt and maternal grandfather; Migraines in her maternal grandmother, maternal uncle, and mother; Seizures in her other. Family History is otherwise negative for migraines, seizures, cognitive impairment, blindness, deafness, birth defects, chromosomal disorder, autism.  Social History Social History   Socioeconomic History  . Marital status: Single    Spouse name: Not on file  . Number of children: Not on file  . Years of education: Not on file  . Highest education level: Not on file  Occupational History  . Not on file  Social Needs  . Financial resource strain: Not on file  . Food insecurity:    Worry: Not on file    Inability: Not on file  . Transportation needs:    Medical: Not on file    Non-medical: Not on file  Tobacco Use  . Smoking status: Never Smoker  . Smokeless tobacco: Never Used  Substance and Sexual Activity  . Alcohol use: No  . Drug use: No  . Sexual activity: Never  Lifestyle  . Physical activity:    Days per week: Not on file    Minutes per session: Not on file  . Stress: Not on file  Relationships  . Social connections:    Talks on phone: Not on file    Gets together: Not on file    Attends religious service: Not on file    Active  member of club or organization: Not on file    Attends meetings of clubs or organizations: Not on file    Relationship status: Not on file  Other Topics Concern  . 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   She attended [employer].   She lives with both parents. She has two older sisters.   She enjoys chilling, shopping, and hanging with friends.   She works at [employer] in [employer].    Allergies Allergies  Allergen Reactions  . Oseltamivir Phosphate Other (See Comments)    Family relates connection between tamiflu and seizures    Physical Exam BP 120/80   Pulse 76   Ht 5' 3.5" (1.613 m)   Wt 206 lb 12.8 oz (93.8 kg)   BMI 36.06 kg/m  General: Well developed, well nourished obese young woman, seated on exam table, in no evident distress, black hair, brown eyes, right handed Head: Head normocephalic and atraumatic.  Oropharynx benign. She has clear nasal secretions.  Neck: Supple with no carotid bruits Cardiovascular: Regular rate and rhythm, no murmurs Respiratory: Breath sounds clear to auscultation Musculoskeletal: No obvious deformities or scoliosis Skin: No rashes or neurocutaneous lesions  Neurologic Exam Mental Status: Awake and fully  alert.  Oriented to place and time.  Recent and remote memory intact.  Attention span, concentration, and fund of knowledge appropriate.  Mood and affect appropriate. Cranial Nerves: Fundoscopic exam reveals sharp disc margins.  Pupils equal, briskly reactive to light.  Extraocular movements full without nystagmus.  Visual fields full to confrontation.  Hearing intact and symmetric to finger rub.  Facial sensation intact.  Face tongue, palate move normally and symmetrically.  Neck flexion and extension normal. Motor: Normal bulk and tone. Normal strength in all tested extremity muscles. Sensory: Intact to touch and temperature in all extremities.  Coordination: Rapid alternating movements normal in all  extremities.  Finger-to-nose and heel-to shin performed accurately bilaterally.  Romberg negative. Gait and Station: Arises from chair without difficulty.  Stance is normal. Gait demonstrates normal stride length and balance.   Able to heel, toe and tandem walk without difficulty. Reflexes: 1+ and symmetric. Toes downgoing.  Impression 1.  Nocturnal seizures 2.  Migraine without aura 3.  Episodic tension headache 4.  Iron deficiency anemia 5.  Seasonal allergies  Recommendations for plan of care The patient's previous [facility] records were reviewed. Tenicia has neither had nor required imaging studies since the last visit. She had lab studies that were reviewed with her today. She is a 22 year old young woman with history of headaches and seizures. She is taking and tolerating Lamotrigine and has remained seizure free since starting the medication in September 2019. She has history of headaches but they have not been problematic since her last visit. She is taking iron supplements for iron deficiency anemia and has had improvement in her Hemoglobin and Hematocrit. I instructed Shanautica to continue the Lamotrigine and to let me know if she has any seizures. I also instructed her to continue iron supplements and to follow up with her PCP about that. We also talked about her allergy symptoms and I told Srinidhi that she can try Claritin, Allegra or Zyrtec for her allergy symptoms but to avoid Benadryl as it can lower antiepileptic drug levels. I will see her back in follow up in 6 months or sooner if needed. We will repeat lab studies at that time. Travia and her mother agreed with the plans made today.   The medication list was reviewed and reconciled.  No changes were made in the prescribed medications today.  A complete medication list was provided to the patient.  Allergies as of 02/01/2018      Reactions   Oseltamivir Phosphate Other (See Comments)   Family relates connection between tamiflu and  seizures      Medication List        Accurate as of 02/01/18 11:48 AM. Always use your most recent med list.          lamoTRIgine 25 MG tablet Commonly known as:  LAMICTAL Take 2 tablets twice per day       Total time spent with the patient was 20 minutes, of which 50% or more was spent in counseling and coordination of care.   Elveria Rising NP-C

## 2018-02-01 NOTE — Patient Instructions (Signed)
Thank you for coming in today.   Instructions for you until your next appointment are as follows: 1. Continue taking Lamotrigine as you have been doing.  2. Let me know if you have any seizures 3. Continue taking iron supplements and follow up with your PCP for iron deficiency anemia 4. Try Claritin, Allegra or Zyrtec for your allergy symptoms 5. Please sign up for MyChart if you have not done so 6. Please plan to return for follow up in 6 months or sooner if needed.

## 2018-02-25 ENCOUNTER — Telehealth (INDEPENDENT_AMBULATORY_CARE_PROVIDER_SITE_OTHER): Payer: Self-pay | Admitting: Family

## 2018-02-25 NOTE — Telephone Encounter (Signed)
°  Who's calling (name and relationship to patient) : Juanito Doom (Mother)  Best contact number: [phone] Provider they see: Inetta Fermo  Reason for call: Mom wanted to know if it would be okay for pt to have an IUD as a form of birth control, as various birth controls decreases the effectiveness of pt's seizure medication. Please advise.

## 2018-02-25 NOTE — Telephone Encounter (Signed)
Spoke with mom about her phone message. I informed her that Inetta Fermo is not in the office today or tomorrow. I invited her to speak with the on call doctor but she was hesitant with that. She stated that she will wait until Monday for Dr. Sharene Skeans to return.

## 2018-03-01 NOTE — Telephone Encounter (Signed)
5-minute phone call.  Patient wants to be on contraceptives because she is sexually active.  Family asked about Mirena is an IUD which I think is fine.  I told her that in order to protect her self and getting infection that her partner needed to use a condom when they were having sex.  We will send a headache calendar.  She apparently had a migraine last week.

## 2018-03-01 NOTE — Telephone Encounter (Signed)
Headache calendars have been placed up front to be mailed 

## 2018-12-06 ENCOUNTER — Telehealth (INDEPENDENT_AMBULATORY_CARE_PROVIDER_SITE_OTHER): Payer: Self-pay | Admitting: Radiology

## 2018-12-06 DIAGNOSIS — R569 Unspecified convulsions: Secondary | ICD-10-CM

## 2018-12-06 MED ORDER — LAMOTRIGINE 25 MG PO TABS: ORAL_TABLET | ORAL | 0 refills | Status: DC

## 2018-12-06 NOTE — Telephone Encounter (Signed)
Patient was given enough medication so last her until her appointment.

## 2018-12-06 NOTE — Telephone Encounter (Signed)
  Who's calling (name and relationship to patient) : [place] contact number: [phone]  Provider they see: Rockwell [place]    Reason for call: Pharmacy called, Jonice is trying to have the Lamictal refilled. Pharmacy advised she has not had a refill since May of 2020 this year. Please direct pharmacy if they need to refill this medication or if a new RX needs to be sent.    PRESCRIPTION REFILL ONLY  Name of prescription:  Pharmacy:

## 2018-12-06 NOTE — Telephone Encounter (Signed)
°  Who's calling (name and relationship to patient) : Trudah (mom)  Best contact number: [phone]  Provider they see: Cloretta Ned   Reason for call: Patient out of medication.  Schedule follow up Burton  Name of prescription: Lamictal   Pharmacy: [facility] [address]

## 2018-12-21 ENCOUNTER — Ambulatory Visit (INDEPENDENT_AMBULATORY_CARE_PROVIDER_SITE_OTHER): Payer: 59 | Admitting: Family

## 2018-12-21 ENCOUNTER — Encounter (INDEPENDENT_AMBULATORY_CARE_PROVIDER_SITE_OTHER): Payer: Self-pay | Admitting: Family

## 2018-12-21 ENCOUNTER — Other Ambulatory Visit: Payer: Self-pay

## 2018-12-21 VITALS — BP 120/70 | Ht 63.0 in | Wt 216.4 lb

## 2018-12-21 DIAGNOSIS — G40309 Generalized idiopathic epilepsy and epileptic syndromes, not intractable, without status epilepticus: Secondary | ICD-10-CM

## 2018-12-21 DIAGNOSIS — R569 Unspecified convulsions: Secondary | ICD-10-CM

## 2018-12-21 DIAGNOSIS — G43009 Migraine without aura, not intractable, without status migrainosus: Secondary | ICD-10-CM

## 2018-12-21 MED ORDER — SUMATRIPTAN SUCCINATE 25 MG PO TABS: ORAL_TABLET | ORAL | 0 refills | Status: DC

## 2018-12-21 MED ORDER — LAMOTRIGINE 25 MG PO TABS: ORAL_TABLET | ORAL | 5 refills | Status: DC

## 2018-12-21 NOTE — Progress Notes (Signed)
Olivia Castillo   MRN:  [id number]  Oct 12, 1995   Provider: Elveria Rising NP-C Location of Care: [facility]  Visit type: Routine visit  Last visit: 02/01/2018  Referral source: Dr. Roma Schanz History from: patient, mom, and chcn chart  Brief history:  History of headaches and seizures. She is taking and tolerating Lamotrigine for nocturnal seizures. She also has history of iron deficiency anemia.    Today's concerns: Olivia Castillo and her mother report today that she was doing well until a couple of weeks ago when her mother awakened hearing a knocking noise, and realized that Olivia Castillo was having a seizure during sleep. She said that she went to her and found her jerking with choking sounds, then the jerking stopped and she had snoring respirations. Mom feels that seizure lasted 5 minutes. She brought in a video that showed Olivia Castillo lying on her abdomen, snoring, unresponsive to her mother attempting to awaken her for at least 2 minutes, then rousing briefly then returning to sleep. When she awakened, she got up to go to the rest room and vomited while there. Olivia Castillo says that she stopped taking Lamotrigine about 6 or 7 months ago because she had not had seizures, but restarted it after the nocturnal seizure earlier this month.  Olivia Castillo reports that she had headaches for a few days prior the seizure but has been otherwise generally healthy since she was last seen. With the headaches she has holocephalic pain and intolerance to light and noise. She takes Ibuprofen  but says that it doesn't always give relief. Mom feels that she has had more frequent headaches and wonders if they triggered the seizure.   Neither Olivia Castillo nor her mother have other health concerns for her today other than previously mentioned.   Review of systems: Please see HPI for neurologic and other pertinent review of systems. Otherwise all other systems were reviewed and were  negative.  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 I concluded that she had migraine without aura and tension-type headaches. 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 She went to bed quite late and slept soundly for long periods of time. I recommended melatonin and possibly clonidine as well assleep hygiene.  I saw her again January 29, 2009 with history of headaches twice a week. She came home early on 2 occasions. I noted positive family history of migraines, a normal neurological examination, and a diagnosis of migraine without aura, migraine with aura and episodic tension type headaches. Mother mentioned that the patient had headaches and lost vision at times when her hair was being combed, often without changing position or elevating her head. These episodes were not  associated with syncope because she did not lose consciousness nor fall.  She had onset of nocturnal seizures in February 10, 2011. This may have been preceded by an episode of twisting of her mouth associated clot laughter during an  episode of flu in late November, 2012. CT scan of the brain February 05, 2011 was normal without contrast. EEG February 11, 2011 was normal with the patient awake and drowsy.  Routine EEG in December 2013 was normal.Patient was hospitalized due to seizure activity in 2013.  A 48-hour ambulatory EEG showed 12 electrographic seizures lasting 45 to 102 seconds with a central 6 hertz theta range activity that localized to either side with secondary generalization and a generalized discharge to 20 hertz beta range activity shifting to 6 hertz generalized theta range activity followed by delta. Shifting frequencies convinced me that this was seizure activity as opposed to a rhythmic mid-temporal discharge.  She tapered off Lamotrigine in 2014.  She had problems with bedwetting between ages 89 and 89.  Birth History 7 lbs. 3 oz. infant born at term to a 23 year old gravida 3 para 2000 woman. Gestation was complicated by spotting mother recalls having "some heart trouble". She can't recall the details. She had excessive nausea during the first trimester and took an antiemetic. Labor lasted for 23 hours mother received epidural anesthesia. Normal spontaneous vaginal delivery. Delivery room was uncomplicated except for suctioning within her mouth. She did not require resuscitation.  Nursery course was uneventful. She went home with her mother.  Growth and development is recorded on the chart is normal.  Behavior History intermittent explosive anger, performance anxiety;as a child she would become upset easily which continued into her teen years  Surgical history: History reviewed. No pertinent surgical history.   Family history: family history includes ADD / ADHD in her cousin; Heart disease in her maternal grandfather; Hypertension in her maternal aunt and maternal grandfather; Migraines in her maternal grandmother, maternal uncle, and mother; Seizures in an other family member.    Social history: Social History   Socioeconomic History   Marital status: Single    Spouse name: Not on file   Number of children: Not on file   Years of education: Not on file   Highest education level: Not on file  Occupational History   Not on file  Social Needs   Financial resource strain: Not on file   Food insecurity    Worry: Not on file    Inability: Not on file   Transportation needs    Medical: Not on file    Non-medical: Not on file  Tobacco Use   Smoking status: Never Smoker   Smokeless tobacco: Never Used  Substance and Sexual Activity   Alcohol use: No   Drug use: No   Sexual activity: Never  Lifestyle   Physical activity    Days per week: Not on file    Minutes per session: Not on file   Stress: Not on file  Relationships   Social connections    Talks on phone: Not on file    Gets together: Not on file    Attends religious service: Not on file    Active member of club or organization: Not on file    Attends meetings of clubs or organizations: Not on file    Relationship status: Not on file   Intimate partner violence    Fear of current or ex partner: Not on file    Emotionally abused: Not on file  Physically abused: Not on file    Forced sexual activity: Not on file  Other Topics Concern   Not on file  Social History Narrative   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   She attended [employer].   She lives with both parents. She has two older sisters.   She enjoys chilling, shopping, and hanging with friends.   She works at [employer] in [employer].     Past/failed meds:   Allergies: Allergies  Allergen Reactions   Oseltamivir Phosphate Other (See Comments)    Family relates connection between tamiflu and seizures      Immunizations:  There is no immunization history on file for this patient.    Diagnostics/Screenings: 10/30/17 - Sleep deprived EEG - This is a normal record with the patient awake, drowsy and  asleep.  A normal EEG does not rule out the presence of seizures. Wyline Copas, MD  Physical Exam: BP 120/70    Ht 5\' 3"  (1.6 m)    Wt 216 lb 6.4 oz (98.2 kg)    BMI 38.33 kg/m   General: Well developed, well nourished obese young woman, seated on exam table, in no evident distress, black hair, brown eyes, right handed Head: Head normocephalic and atraumatic.  Oropharynx benign. Neck: Supple with no carotid bruits Cardiovascular: Regular rate and rhythm, no murmurs Respiratory: Breath sounds clear to auscultation Musculoskeletal: No obvious deformities or scoliosis Skin: No rashes or neurocutaneous lesions  Neurologic Exam Mental Status: Awake and fully alert.  Oriented to place and time.  Recent and remote memory intact.  Attention span, concentration, and fund of knowledge appropriate.  Mood and affect appropriate. Cranial Nerves: Fundoscopic exam reveals sharp disc margins.  Pupils equal, briskly reactive to light.  Extraocular movements full without nystagmus.  Visual fields full to confrontation.  Hearing intact and symmetric to finger rub.  Facial sensation intact.  Face tongue, palate move normally and symmetrically.  Neck flexion and extension normal. Motor: Normal bulk and tone. Normal strength in all tested extremity muscles. Sensory: Intact to touch and temperature in all extremities.  Coordination: Rapid alternating movements normal in all extremities.  Finger-to-nose and heel-to shin performed accurately bilaterally.  Romberg negative. Gait and Station: Arises from chair without difficulty.  Stance is normal. Gait demonstrates normal stride length and balance.   Able to heel, toe and tandem walk without difficulty. Reflexes: 1+ and symmetric. Toes downgoing.  Impression: 1. Nocturnal seizures 2. Migraine without aura 3. Episodic tension headache 4. Iron deficiency anemia 5. Seasonal allergies  Recommendations for plan of care: The patient's previous [facility] records were  reviewed. Olivia Castillo has neither had nor required imaging or lab studies since the last visit. She is a 23 year old young woman with history of nocturnal seizures, headaches, iron deficiency anemia and seasonal allergies. She is taking and tolerating Lamotrigine for her seizure disorder. She had stopped it on her own 6-7 months ago and had been seizure free until 2 weeks ago when she had a seizure during sleep. She restarted the medication after that. Olivia Castillo has also had frequent headaches, and I asked her to keep a headache diary so we can determine if she needs to take migraine preventative medication. I talked with her about headaches and migraines. I gave her Sumatriptan to take for severe headaches and explained that she needs to take it at the onset of migraine along with Ibuprofen 400mg . I asked her to complete the headache diary and bring it with her when  she returns in 1 month for follow up. She and her mother agreed with the plans made today.  The medication list was reviewed and reconciled. No changes were made in the prescribed medications today. A complete medication list was provided to the patient.  Allergies as of 12/21/2018      Reactions   Oseltamivir Phosphate Other (See Comments)   Family relates connection between tamiflu and seizures      Medication List       Accurate as of December 21, 2018 10:38 AM. If you have any questions, ask your nurse or doctor.        lamoTRIgine 25 MG tablet Commonly known as: LAMICTAL Take 2 tablets twice per day      Total time spent with the patient was 25  minutes, of which 50% or more was spent in counseling and coordination of care.  Elveria Rising NP-C [facility] Ph. [phone] Fax [phone]

## 2018-12-21 NOTE — Patient Instructions (Signed)
Thank you for coming in today.   Instructions for you until your next appointment are as follows: 1. Continue to take the Lamotrigine 25mg  - 2 tablets in the morning and 2 tablets in the evening. Try not to miss any doses.  2. Keep track of your headaches using the headache calendar I gave to you. This is so we can see how often and how severe the headaches are. If the migraines are frequent, we may need to try a daily medication to reduce the frequency of the headaches.  3. I sent in a prescription for Sumatriptan 25mg . This is a medication to take when you have a migraine to make it go away. To take it, take 1 tablet as soon as you realize the migraine is present. Also take Ibuprofen 400mg  (2 of the 200mg  tablets). The Ibuprofen works with the Sumatriptan to stop the migraine. If the migraine is still there in 2 hours, you can take another Sumatriptan tablet.  4. Do not take more than 2 of the Sumatriptan tablets in 1 day.  5. Work on drinking plenty of water - at least about 48 oz per day - as inadequate hydration is known to trigger headaches. It is also important to eat regular meals and to get at least 8-9 hours of sleep each night.  6. Please sign up for MyChart if you have not done so 7. Please plan to return for follow up in 1 month or sooner if needed. Bring your headache diary with you so we can review it together.

## 2018-12-23 ENCOUNTER — Encounter (INDEPENDENT_AMBULATORY_CARE_PROVIDER_SITE_OTHER): Payer: Self-pay | Admitting: Family

## 2019-01-24 ENCOUNTER — Ambulatory Visit (INDEPENDENT_AMBULATORY_CARE_PROVIDER_SITE_OTHER): Payer: 59 | Admitting: Family

## 2019-01-26 ENCOUNTER — Encounter (INDEPENDENT_AMBULATORY_CARE_PROVIDER_SITE_OTHER): Payer: Self-pay | Admitting: Family

## 2019-01-26 ENCOUNTER — Ambulatory Visit (INDEPENDENT_AMBULATORY_CARE_PROVIDER_SITE_OTHER): Payer: 59 | Admitting: Family

## 2019-01-26 ENCOUNTER — Other Ambulatory Visit: Payer: Self-pay

## 2019-01-26 VITALS — BP 112/70 | HR 72 | Ht 63.0 in | Wt 219.0 lb

## 2019-01-26 DIAGNOSIS — G40309 Generalized idiopathic epilepsy and epileptic syndromes, not intractable, without status epilepticus: Secondary | ICD-10-CM

## 2019-01-26 DIAGNOSIS — G43009 Migraine without aura, not intractable, without status migrainosus: Secondary | ICD-10-CM | POA: Diagnosis not present

## 2019-01-26 DIAGNOSIS — G40209 Localization-related (focal) (partial) symptomatic epilepsy and epileptic syndromes with complex partial seizures, not intractable, without status epilepticus: Secondary | ICD-10-CM

## 2019-01-26 DIAGNOSIS — G44219 Episodic tension-type headache, not intractable: Secondary | ICD-10-CM | POA: Diagnosis not present

## 2019-01-26 DIAGNOSIS — R569 Unspecified convulsions: Secondary | ICD-10-CM

## 2019-01-26 MED ORDER — SUMATRIPTAN SUCCINATE 25 MG PO TABS: ORAL_TABLET | ORAL | 5 refills | Status: DC

## 2019-01-26 NOTE — Patient Instructions (Signed)
Thank you for coming in today.   Instructions for you until your next appointment are as follows: 1. Continue to keep track of your headaches. If you are having more than 4 migraines per month, let me know and we will try a medication that is designed to prevent migraines from happening 2. Remember that it is important for you to get enough sleep, to avoid skipping meals, and to drink plenty of water each day as these things are known to reduce the frequency and severity of headaches.  3. Continue to take the Lamotrigine for seizures. Try not to miss any doses as this may lead to you having a seizure.  4. Please let me know if you have any seizures 5. Please sign up for MyChart if you have not done so 6. Please plan to return for follow up in 2 months or sooner if needed.

## 2019-01-26 NOTE — Progress Notes (Signed)
Olivia Castillo   MRN:  [id number]  September 08, 1995   Provider: Elveria Risingina Carmen Vallecillo NP-C Location of Care: [facility]  Visit type: Routine visit  Last visit: 12/21/2018  Referral source: Roma Schanzhristopher Miller, MD History from: patient and chcn chart  Brief history:  Copied from previous record: History of headaches and seizures. She is taking and tolerating Lamotrigine for nocturnal seizures. She also has history of iron deficiency anemia.   Today's concerns:  Olivia Castillo reports no seizures since her last visit. She says that she has been compliant with taking Lamotrigine. When she was last seen, she reported frequent headaches and I asked her to keep a headache diary, which she did. Since October 27th, she has had 4 migraine headaches and 4 tension headaches. She says that Sumatriptan gives her relief of a migraine within 30 minutes to 1 hour.  She says that she gets at least 8 hours of sleep each night, does not skip meals, and drinks water during the day.   Olivia Castillo has been otherwise generally healthy since her last visit. She has no other health concerns today other than previously mentioned.   Review of systems: Please see HPI for neurologic and other pertinent review of systems. Otherwise all other systems were reviewed and were negative.  Problem List: Patient Active Problem List   Diagnosis Date Noted  . 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 Iron deficiency anemia 02/01/2018  . Intermittent explosive disorder in adult 10/28/2016  . Performance anxiety 10/28/2016  . 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 Partial epilepsy with impairment of consciousness (HCC) 10/13/2012  . Episodic tension type headache 10/13/2012  . Migraine without aura 10/13/2012  . Migraine with aura, without mention of intractable migraine without mention of status migrainosus 10/13/2012  . Dysfunctions associated with sleep stages or arousal from sleep 10/13/2012   . Headache(784.0) 10/13/2012     Past Medical History:  Diagnosis Date  . Headache   . Seizure [facility](HCC)     Past medical history comments: See HPI Copied from previous record: patient was evaluated October 09, 2005 for headaches and insomnia. I concluded that she had migraine without aura and tension-type headaches. 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 She went to bed quite late and slept soundly for long periods of time. I recommended melatonin and possibly Clonidine as well as sleep hygiene.  I saw her again January 29, 2009 with history of headaches twice a week. She came home early on 2 occasions. I noted positive family history of migraines, a normal neurological examination, and a diagnosis of migraine without aura, migraine with aura and episodic tension type headaches. Mother mentioned that the patient had headaches and lost vision at times when her hair was being combed, often without changing position or elevating her head. These episodes were not associated with syncope because she did not lose consciousness nor fall.  She had onset of nocturnal seizures in February 10, 2011. This may have been preceded by an episode of twisting of her mouth associated clot laughter during an episode of flu in late November, 2012. CT scan of the brain February 05, 2011 was normal without contrast. EEG February 11, 2011 was normal with the patient awake and drowsy.  Routine EEG in December 2013 was normal.Patient was hospitalized due to seizure activity in 2013.  A 48-hour ambulatory EEG showed 12 electrographic seizures lasting 45 to 102 seconds with a central 6 hertz theta range activity that localized  to either side with secondary generalization and a generalized discharge to 20 hertz beta range activity shifting to 6 hertz generalized theta range activity followed by delta. Shifting frequencies convinced me that this was seizure activity as opposed to a rhythmic  mid-temporal discharge.  She tapered off Lamotrigine in 2014.  She had problems with bedwetting between ages 36 and 74.  Birth History 7 lbs. 3 oz. infant born at term to a 80 year old gravida 3 para 2002 woman. Gestation was complicated by spotting mother recalls having "some heart trouble". She can't recall the details. She had excessive nausea during the first trimester and took an antiemetic. Labor lasted for 23 hours mother received epidural anesthesia. Normal spontaneous vaginal delivery. Delivery room was uncomplicated except for suctioning within her mouth. She did not require resuscitation.  Nursery course was uneventful. She went home with her mother.  Growth and development is recorded on the chart is normal.  Behavior History intermittent explosive anger, performance anxiety;as a child she would become upset easily which continued into her teen years   Surgical history: History reviewed. No pertinent surgical history.   Family history: family history includes ADD / ADHD in her cousin; Heart disease in her maternal grandfather; Hypertension in her maternal aunt and maternal grandfather; Migraines in her maternal grandmother, maternal uncle, and mother; Seizures in an other family member.   Social history: Social History   Socioeconomic History  . Marital status: Single    Spouse name: Not on file  . Number of children: Not on file  . Years of education: Not on file  . Highest education level: Not on file  Occupational History  . Not on file  Social Needs  . Financial resource strain: Not on file  . Food insecurity    Worry: Not on file    Inability: Not on file  . Transportation needs    Medical: Not on file    Non-medical: Not on file  Tobacco Use  . Smoking status: Never Smoker  . Smokeless tobacco: Never Used  Substance and Sexual Activity  . Alcohol use: No  . Drug use: No  . Sexual activity: Never  Lifestyle  . Physical activity    Days per  week: Not on file    Minutes per session: Not on file  . Stress: Not on file  Relationships  . Social Musician on phone: Not on file    Gets together: Not on file    Attends religious service: Not on file    Active member of club or organization: Not on file    Attends meetings of clubs or organizations: Not on file    Relationship status: Not on file  . Intimate partner violence    Fear of current or ex partner: Not on file    Emotionally abused: Not on file    Physically abused: Not on file    Forced sexual activity: Not on file  Other Topics Concern  . 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   She attended [employer].   She lives with both parents. She has two older sisters.   She enjoys chilling, shopping, and hanging with friends.      Past/failed meds:   Allergies: Allergies  Allergen Reactions  . Oseltamivir Phosphate Other (See Comments)    Family relates connection between tamiflu and seizures      Immunizations:  There is no immunization history on file  for this patient.    Diagnostics/Screenings: 10/30/17 - Sleep deprived EEG - This is anormalrecord with the patient awake, drowsy and asleep.A normal EEG does not rule out the presence of seizures. Olivia Carwin, MD  Physical Exam: BP 112/70   Pulse 72   Ht  (1.6 m)   Wt 219 lb (99.3 kg)   BMI 38.79 kg/m   General: Well developed, well nourished obese young woman, seated on exam table, in no evident distress, black hair, brown eyes, right handed Head: Head normocephalic and atraumatic.  Oropharynx benign. Neck: Supple with no carotid bruits Cardiovascular: Regular rate and rhythm, no murmurs Respiratory: Breath sounds clear to auscultation Musculoskeletal: No obvious deformities or scoliosis Skin: No rashes or neurocutaneous lesions  Neurologic Exam Mental Status: Awake and fully alert.  Oriented to place and time.  Recent and remote  memory intact.  Attention span, concentration, and fund of knowledge appropriate.  Mood and affect appropriate. Cranial Nerves: Fundoscopic exam reveals sharp disc margins.  Pupils equal, briskly reactive to light.  Extraocular movements full without nystagmus.  Visual fields full to confrontation.  Hearing intact and symmetric to finger rub.  Facial sensation intact.  Face tongue, palate move normally and symmetrically.  Neck flexion and extension normal. Motor: Normal bulk and tone. Normal strength in all tested extremity muscles. Sensory: Intact to touch and temperature in all extremities.  Coordination: Rapid alternating movements normal in all extremities.  Finger-to-nose and heel-to shin performed accurately bilaterally.  Romberg negative. Gait and Station: Arises from chair without difficulty.  Stance is normal. Gait demonstrates normal stride length and balance.   Able to heel, toe and tandem walk without difficulty. Reflexes: 1+ and symmetric. Toes downgoing.  Impression: 1. Nocturnal seizures 2. Migraine without aura 3. Episodic tension headache 4. Iron deficiency anemia 5. Seasonal allergies   Recommendations for plan of care: The patient's previous [facility] records were reviewed. Dachelle has neither had nor required imaging or lab studies since the last visit. She is a 23 year old woman with history of nocturnal seizures, migraine and tension headaches, iron deficiency anemia and seasonal allergies. She has remained seizure free since her last visit and says that she is compliant with taking Lamotrigine. Dayanis has been keeping a headache diary and has had 4 migraines and 4 tension headaches since October 27th. We talked about preventative medication for the migraines but she decided to wait on that for now. I reminded her of the need to get sufficient sleep, to avoid skipping meals and to drink plenty of water each day. I asked her to continue to keep headache diaries and to return in 2  months for evaluation. I will plan to talk with her about transition of care to an adult neurology pratice at her next visit. Soriah agreed with the plans made today.   The medication list was reviewed and reconciled. No changes were made in the prescribed medications today. A complete medication list was provided to the patient.  Allergies as of 01/26/2019      Reactions   Oseltamivir Phosphate Other (See Comments)   Family relates connection between tamiflu and seizures      Medication List       Accurate as of January 26, 2019 10:34 AM. If you have any questions, ask your nurse or doctor.        lamoTRIgine 25 MG tablet Commonly known as: LAMICTAL Take 2 tablets twice per day   SUMAtriptan 25 MG tablet Commonly known as: IMITREX  Take 1 tablet at onset of migraine along with Ibuprofen 400mg . May repeat once in 2 hours if headache persists or recurs.       Total time spent with the patient was 20 minutes, of which 50% or more was spent in counseling and coordination of care.  Rockwell Germany NP-C [facility] Ph. [phone] Fax [phone]

## 2019-01-29 ENCOUNTER — Encounter (INDEPENDENT_AMBULATORY_CARE_PROVIDER_SITE_OTHER): Payer: Self-pay | Admitting: Family

## 2019-03-29 ENCOUNTER — Ambulatory Visit (INDEPENDENT_AMBULATORY_CARE_PROVIDER_SITE_OTHER): Payer: 59 | Admitting: Family

## 2019-04-11 ENCOUNTER — Telehealth (INDEPENDENT_AMBULATORY_CARE_PROVIDER_SITE_OTHER): Payer: Self-pay | Admitting: Family

## 2019-04-11 NOTE — Telephone Encounter (Signed)
Attempted to call patient and let her know she should have refills at the pharmacy. No answer and no vm set up

## 2019-04-11 NOTE — Telephone Encounter (Signed)
Mom returned my call, I informed her that she should have refills at the pharmacy and to give them a call

## 2019-04-11 NOTE — Telephone Encounter (Signed)
.   Who's calling (name and relationship to patient) : Olivia Castillo (patient) Best contact number: [phone] Provider they see: Good[facility]  Reason for call: Need Rx refill of medication     PRESCRIPTION REFILL ONLY  Name of prescription: Sumatriptan 25mg   Pharmacy:[facility] -[id number] [facility]

## 2019-05-30 ENCOUNTER — Ambulatory Visit (INDEPENDENT_AMBULATORY_CARE_PROVIDER_SITE_OTHER): Payer: 59 | Admitting: Family

## 2019-05-30 ENCOUNTER — Encounter (INDEPENDENT_AMBULATORY_CARE_PROVIDER_SITE_OTHER): Payer: Self-pay | Admitting: Family

## 2019-05-30 ENCOUNTER — Other Ambulatory Visit: Payer: Self-pay

## 2019-05-30 VITALS — BP 110/68 | HR 76 | Ht 63.0 in | Wt 225.4 lb

## 2019-05-30 DIAGNOSIS — G43009 Migraine without aura, not intractable, without status migrainosus: Secondary | ICD-10-CM | POA: Diagnosis not present

## 2019-05-30 DIAGNOSIS — R569 Unspecified convulsions: Secondary | ICD-10-CM | POA: Diagnosis not present

## 2019-05-30 DIAGNOSIS — G40309 Generalized idiopathic epilepsy and epileptic syndromes, not intractable, without status epilepticus: Secondary | ICD-10-CM | POA: Diagnosis not present

## 2019-05-30 DIAGNOSIS — D508 Other iron deficiency anemias: Secondary | ICD-10-CM

## 2019-05-30 DIAGNOSIS — G40209 Localization-related (focal) (partial) symptomatic epilepsy and epileptic syndromes with complex partial seizures, not intractable, without status epilepticus: Secondary | ICD-10-CM | POA: Diagnosis not present

## 2019-05-30 DIAGNOSIS — G44219 Episodic tension-type headache, not intractable: Secondary | ICD-10-CM

## 2019-05-30 MED ORDER — SUMATRIPTAN SUCCINATE 25 MG PO TABS: ORAL_TABLET | ORAL | 5 refills | Status: DC

## 2019-05-30 MED ORDER — LAMOTRIGINE 25 MG PO TABS: ORAL_TABLET | ORAL | 5 refills | Status: DC

## 2019-05-30 NOTE — Progress Notes (Signed)
RICKIYA PICARIELLO   MRN:  [id number]  Aug 20, 1995   Provider: Elveria Rising NP-C Location of Care: [facility]  Visit type: Routine visit  Last visit: 01/26/2019  Referral source: Roma Schanz, MD History from: patient and chcn chart  Brief history:  Copied from previous record: History of headaches and seizures. She is taking and tolerating Lamotrigine for nocturnal seizures. She also has history of iron deficiency anemia.  Today's concerns: Michaiah reports today that she has remained seizure free and has been compliant with taking Lamotrigine. She has an occasional headache, typically during her menstrual cycle but says that it is not severe or problematic. She denies any migraines since her last visit.   Katilyn has been otherwise generally healthy and doing well. She has no other health concerns today other than previously mentioned.   Review of systems: Please see HPI for neurologic and other pertinent review of systems. Otherwise all other systems were reviewed and were negative.  Problem List: Patient Active Problem List   Diagnosis Date Noted  . 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 Iron deficiency anemia 02/01/2018  . Intermittent explosive disorder in adult 10/28/2016  . Performance anxiety 10/28/2016  . 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 Partial epilepsy with impairment of consciousness (HCC) 10/13/2012  . Episodic tension type headache 10/13/2012  . Migraine without aura 10/13/2012  . Migraine with aura, without mention of intractable migraine without mention of status migrainosus 10/13/2012  . Dysfunctions associated with sleep stages or arousal from sleep 10/13/2012  . Headache(784.0) 10/13/2012     Past Medical History:  Diagnosis Date  . Headache   . Seizure [facility])     Past medical history comments: See HPI Copied from previous record: patient was evaluated October 09, 2005 for headaches and  insomnia. I concluded that she had migraine without aura and tension-type headaches. 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 She went to bed quite late and slept soundly for long periods of time. I recommended melatonin and possibly Clonidine as well as sleep hygiene.  I saw her again January 29, 2009 with history of headaches twice a week. She came home early on 2 occasions. I noted positive family history of migraines, a normal neurological examination, and a diagnosis of migraine without aura, migraine with aura and episodic tension type headaches. Mother mentioned that the patient had headaches and lost vision at times when her hair was being combed, often without changing position or elevating her head. These episodes were not associated with syncope because she did not lose consciousness nor fall.  She had onset of nocturnal seizures in February 10, 2011. This may have been preceded by an episode of twisting of her mouth associated clot laughter during an episode of flu in late November, 2012. CT scan of the brain February 05, 2011 was normal without contrast. EEG February 11, 2011 was normal with the patient awake and drowsy.  Routine EEG in December 2013 was normal.Patient was hospitalized due to seizure activity in 2013.  A 48-hour ambulatory EEG showed 12 electrographic seizures lasting 45 to 102 seconds with a central 6 hertz theta range activity that localized to either side with secondary generalization and a generalized discharge to 20 hertz beta range activity shifting to 6 hertz generalized theta range activity followed by delta. Shifting frequencies convinced me that this was seizure activity as opposed to a rhythmic mid-temporal discharge.  She tapered off Lamotrigine in 2014.  She  had problems with bedwetting between ages 81 and 86.  Birth History 7 lbs. 3 oz. infant born at term to a 76 year old gravida 3 para 2002 woman. Gestation was  complicated by spotting mother recalls having "some heart trouble". She can't recall the details. She had excessive nausea during the first trimester and took an antiemetic. Labor lasted for 23 hours mother received epidural anesthesia. Normal spontaneous vaginal delivery. Delivery room was uncomplicated except for suctioning within her mouth. She did not require resuscitation.  Nursery course was uneventful. She went home with her mother.  Growth and development is recorded on the chart is normal.  Behavior History intermittent explosive anger, performance anxiety;as a child she would become upset easily which continued into her teen years  Surgical history: History reviewed. No pertinent surgical history.   Family history: family history includes ADD / ADHD in her cousin; Heart disease in her maternal grandfather; Hypertension in her maternal aunt and maternal grandfather; Migraines in her maternal grandmother, maternal uncle, and mother; Seizures in an other family member.   Social history: Social History   Socioeconomic History  . Marital status: Single    Spouse name: Not on file  . Number of children: Not on file  . Years of education: Not on file  . Highest education level: Not on file  Occupational History  . Not on file  Tobacco Use  . Smoking status: Never Smoker  . Smokeless tobacco: Never Used  Substance and Sexual Activity  . Alcohol use: No  . Drug use: No  . Sexual activity: Never  Other Topics Concern  . 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   She attended [employer].   She lives with both parents. She has two older sisters.   She enjoys chilling, shopping, and hanging with friends.   Social Determinants of Health   Financial Resource Strain:   . Difficulty of Paying Living Expenses:   Food Insecurity:   . Worried About Charity fundraiser in the Last Year:   . Arboriculturist in the Last Year:     Transportation Needs:   . Film/video editor (Medical):   Marland Kitchen Lack of Transportation (Non-Medical):   Physical Activity:   . Days of Exercise per Week:   . Minutes of Exercise per Session:   Stress:   . Feeling of Stress :   Social Connections:   . Frequency of Communication with Friends and Family:   . Frequency of Social Gatherings with Friends and Family:   . Attends Religious Services:   . Active Member of Clubs or Organizations:   . Attends Archivist Meetings:   Marland Kitchen Marital Status:   Intimate Partner Violence:   . Fear of Current or Ex-Partner:   . Emotionally Abused:   Marland Kitchen Physically Abused:   . Sexually Abused:     Past/failed meds:   Allergies: Allergies  Allergen Reactions  . Oseltamivir Phosphate Other (See Comments)    Family relates connection between tamiflu and seizures     Immunizations:  There is no immunization history on file for this patient.    Diagnostics/Screenings: 10/30/17 - Sleep deprived EEG -This is anormalrecord with the patient awake, drowsy and asleep.A normal EEG does not rule out the presence of seizures. Wyline Copas, MD   Physical Exam: BP 110/68   Pulse 76   Ht 5\' 3"  (1.6 m)   Wt 225 lb 6.4 oz (102.2 kg)  BMI 39.93 kg/m   General: Well developed, well nourished obese woman, seated on exam table, in no evident distress, black hair, brown eyes, right handed Head: Head normocephalic and atraumatic.  Oropharynx benign. Neck: Supple with no carotid bruits Cardiovascular: Regular rate and rhythm, no murmurs Respiratory: Breath sounds clear to auscultation Musculoskeletal: No obvious deformities or scoliosis Skin: No rashes or neurocutaneous lesions  Neurologic Exam Mental Status: Awake and fully alert.  Oriented to place and time.  Recent and remote memory intact.  Attention span, concentration, and fund of knowledge appropriate.  Mood and affect appropriate. Cranial Nerves: Fundoscopic exam reveals sharp disc  margins.  Pupils equal, briskly reactive to light.  Extraocular movements full without nystagmus.  Visual fields full to confrontation.  Hearing intact and symmetric to finger rub.  Facial sensation intact.  Face tongue, palate move normally and symmetrically.  Neck flexion and extension normal. Motor: Normal bulk and tone. Normal strength in all tested extremity muscles. Sensory: Intact to touch and temperature in all extremities.  Coordination: Rapid alternating movements normal in all extremities.  Finger-to-nose and heel-to shin performed accurately bilaterally.  Romberg negative. Gait and Station: Arises from chair without difficulty.  Stance is normal. Gait demonstrates normal stride length and balance.   Able to heel, toe and tandem walk without difficulty. Reflexes: 1+ and symmetric. Toes downgoing.  Impression: 1. Nocturnal seizures 2. Migraine without aura 3. Episodic tension headache 4. Iron deficiency anemia 5. Seasonal allergies  Recommendations for plan of care: The patient's previous [facility] records were reviewed. Keshawna has neither had nor required imaging or lab studies since the last visit. She is a 24 year old woman with history of nocturnal seizures that have been controlled with Lamotrigine, migraine and tension headaches, iron deficiency anemia and seasonal allergies. She reports that headaches are not problematic and that she is satisfied with her current treatment plan. I reminded Adlynn of the need for her to be compliant with taking Lamotrigine, and be sure to get at least 8 hours of sleep each night. I asked her to let me know if her headaches become more frequent or more severe. I will otherwise see her back in follow up in 6 months or sooner if needed. She agreed with the plans made today.   The medication list was reviewed and reconciled. No changes were made in the prescribed medications today. A complete medication list was provided to the patient.  Allergies as of  05/30/2019      Reactions   Oseltamivir Phosphate Other (See Comments)   Family relates connection between tamiflu and seizures      Medication List       Accurate as of May 30, 2019 11:59 PM. If you have any questions, ask your nurse or doctor.        lamoTRIgine 25 MG tablet Commonly known as: LAMICTAL Take 2 tablets twice per day   loratadine 5 MG/5ML syrup Commonly known as: CLARITIN loratadine 5 mg/5 mL oral solution  TAKE 10 MLS AT BEDTIME   polyethylene glycol powder 17 GM/SCOOP powder Commonly known as: GLYCOLAX/MIRALAX polyethylene glycol 3350 17 gram/dose oral powder  1 (ONE) CAPFUL, ORAL, DAILY   SODIUM FLUORIDE (DENTAL RINSE) 0.2 % Soln sodium fluoride 0.2 % dental solution  RINSE AND SPIT EVERY DAY AT BEDTIME   SUMAtriptan 25 MG tablet Commonly known as: IMITREX Take 1 tablet at onset of migraine along with Ibuprofen 400mg . May repeat once in 2 hours if headache persists or recurs.  Total time spent with the patient was 15 minutes, of which 50% or more was spent in counseling and coordination of care.  Rockwell Germany NP-C [facility] Ph. [phone] Fax [phone]

## 2019-06-03 ENCOUNTER — Encounter (INDEPENDENT_AMBULATORY_CARE_PROVIDER_SITE_OTHER): Payer: Self-pay | Admitting: Family

## 2019-06-03 NOTE — Patient Instructions (Signed)
Thank you for coming in today.   Instructions for you until your next appointment are as follows: 1. Continue to take Lamotrigine as prescribed. Try not to miss any doses. 2. Remember that it is important for you to get at least 8 hours of sleep each night.  3. Let me know if your headaches become more frequent or more severe, or if you have any seizures. 4. Please sign up for MyChart if you have not done so 5. Please plan to return for follow up in 6 months or sooner if needed.

## 2019-10-10 ENCOUNTER — Telehealth (INDEPENDENT_AMBULATORY_CARE_PROVIDER_SITE_OTHER): Payer: Self-pay | Admitting: Family

## 2019-10-10 DIAGNOSIS — R569 Unspecified convulsions: Secondary | ICD-10-CM

## 2019-10-10 MED ORDER — LAMOTRIGINE 25 MG PO TABS: ORAL_TABLET | ORAL | 5 refills | Status: DC

## 2019-10-10 NOTE — Telephone Encounter (Signed)
  Who's calling (name and relationship to patient) : Trudah (mom)  Best contact number: [phone]  Provider they see: Elveria Rising  Reason for call: Mom states that seizure medication is only being sent in two seek supplies and she wants to know why - states that it is becoming expensive to refill every two weeks.    PRESCRIPTION REFILL ONLY  Name of prescription: lamoTRIgine (LAMICTAL) 25 MG tablet Pharmacy:

## 2019-10-10 NOTE — Telephone Encounter (Signed)
Thank you for updating the prescription. TG

## 2019-10-10 NOTE — Telephone Encounter (Signed)
Spoke to pharmacy and per the chart, there were only 60 tablets being sent in instead of 120. The medication instructions stated for the patient to take two tablets twice daily. With those instructions, mom has been getting refills every two weeks.  I have sent in the correct quantity with hopes that insurance will cover for the month

## 2019-11-09 ENCOUNTER — Telehealth (INDEPENDENT_AMBULATORY_CARE_PROVIDER_SITE_OTHER): Payer: Self-pay | Admitting: Family

## 2019-11-09 DIAGNOSIS — R569 Unspecified convulsions: Secondary | ICD-10-CM

## 2019-11-09 MED ORDER — LAMOTRIGINE 25 MG PO TABS: ORAL_TABLET | ORAL | 5 refills | Status: DC

## 2019-11-09 NOTE — Telephone Encounter (Signed)
  Who's calling (name and relationship to patient) : Trudah ( mom)  Best contact number:[phone]  Provider they see: Elveria Rising  Reason for call: Patient seizures have changed from at night in her sleep to during the day. Mom is not sure if it because patient is not taking her medication correctly or if the dosage is no longer working. Mom wanting to know if bloodwork can indicate her levels of the lamictal. Mom also wanted Inetta Fermo to know she just had a seizure and is recovering from that now. She is very concerned and would like to see if I need to move up her daughters appt?     PRESCRIPTION REFILL ONLY  Name of prescription:  Pharmacy:

## 2019-11-09 NOTE — Telephone Encounter (Signed)
I called and spoke with Mom. She said that since Olivia Castillo was seen in April that she has had at least one nocturnal seizure per month, and recently she has had some seizures while awake. She says that the seizures are convulsive and last 1-2 minutes. She has fallen out of bed with these seizures but had no been injured. Mom says that Olivia Castillo says that she has not missed doses of her medication. Mom had her increase the dose from 50mg  BID to 50mg  AM and 75mg  PM recently. She is in school and working part time, but Mom thinks that she is sleeping enough at night. I recommended that Olivia Castillo come in tomorrow morning to have trough Lamotrigine level drawn. I also recommended that the dose increase to 75mg  BID. Mom agreed with the plans made today. TG

## 2019-11-09 NOTE — Telephone Encounter (Signed)
Please advise 

## 2019-11-13 LAB — LAMOTRIGINE LEVEL: Lamotrigine Lvl: 2.4 ug/mL — ABNORMAL LOW (ref 4.0–18.0)

## 2019-11-14 ENCOUNTER — Other Ambulatory Visit (INDEPENDENT_AMBULATORY_CARE_PROVIDER_SITE_OTHER): Payer: Self-pay | Admitting: Family

## 2019-11-14 DIAGNOSIS — G40209 Localization-related (focal) (partial) symptomatic epilepsy and epileptic syndromes with complex partial seizures, not intractable, without status epilepticus: Secondary | ICD-10-CM

## 2019-11-14 DIAGNOSIS — G40309 Generalized idiopathic epilepsy and epileptic syndromes, not intractable, without status epilepticus: Secondary | ICD-10-CM

## 2019-11-14 DIAGNOSIS — R569 Unspecified convulsions: Secondary | ICD-10-CM

## 2019-11-14 DIAGNOSIS — Z79899 Other long term (current) drug therapy: Secondary | ICD-10-CM

## 2019-11-14 NOTE — Progress Notes (Signed)
I called instructions to have blood drawn in 2 weeks. TG

## 2019-12-05 ENCOUNTER — Encounter (INDEPENDENT_AMBULATORY_CARE_PROVIDER_SITE_OTHER): Payer: Self-pay | Admitting: Family

## 2019-12-05 ENCOUNTER — Ambulatory Visit (INDEPENDENT_AMBULATORY_CARE_PROVIDER_SITE_OTHER): Payer: 59 | Admitting: Family

## 2019-12-05 ENCOUNTER — Other Ambulatory Visit: Payer: Self-pay

## 2019-12-05 VITALS — BP 116/78 | HR 76 | Ht 63.39 in | Wt 221.4 lb

## 2019-12-05 DIAGNOSIS — G44219 Episodic tension-type headache, not intractable: Secondary | ICD-10-CM

## 2019-12-05 DIAGNOSIS — G40209 Localization-related (focal) (partial) symptomatic epilepsy and epileptic syndromes with complex partial seizures, not intractable, without status epilepticus: Secondary | ICD-10-CM

## 2019-12-05 DIAGNOSIS — D508 Other iron deficiency anemias: Secondary | ICD-10-CM

## 2019-12-05 DIAGNOSIS — G40309 Generalized idiopathic epilepsy and epileptic syndromes, not intractable, without status epilepticus: Secondary | ICD-10-CM | POA: Diagnosis not present

## 2019-12-05 DIAGNOSIS — G43009 Migraine without aura, not intractable, without status migrainosus: Secondary | ICD-10-CM

## 2019-12-05 DIAGNOSIS — Z79899 Other long term (current) drug therapy: Secondary | ICD-10-CM | POA: Diagnosis not present

## 2019-12-05 NOTE — Patient Instructions (Signed)
Thank you for coming in today.   Instructions for you until your next appointment are as follows: 1. Continue taking Lamotrigine 3 tablets in the morning and 3 tablets at night 2. Let me know if you have any seizures 3. I have given you a blood test order. Please get the blood drawn soon so we can tell if you have enough medication in your blood stream to prevent seizures from happening. I will call you when I have the results.  4. Please sign up for MyChart if you have not done so 5. Please plan to return for follow up in 3 months or sooner if needed.

## 2019-12-05 NOTE — Progress Notes (Signed)
Olivia Castillo   MRN:  [id number]  [birth year]   Provider: Elveria Rising NP-C Location of Care: [facility]  Visit type: Follow up  Last visit: 05/30/2019  Referral source: Marcelle Overlie, MD History from: Patient, [facility] Chart  Brief history:  Copied from previous record: History of headaches and seizures. She is taking and tolerating Lamotrigine for nocturnal seizures. She also has history of iron deficiency anemia.  Today's concerns:Seizure disorder Olivia Castillo is seen today in follow up for seizure disorder. In September 2021 her mother contacted me to report that Olivia Castillo was experiencing seizures during sleep and falling out of bed. A Lamotrigine level was obtained and found to be low at 2.90mcg/ml. I recommended increase in dose and to repeat the Lamotrigine level but she has not done so for reasons that are unclear to me. Olivia Castillo tells me today that she has not experienced seizures since the dose was increased. She says that she is compliant with medication.   Olivia Castillo reports that she is working part time and attending school part time. She sometimes has difficulty with sleep because of rotating shifts at work.   Olivia Castillo reports that she has not had significant migraines since her last visit. She sometimes has a headache during her menstrual cycle but says that it is not typically severe.   Olivia Castillo has been otherwise generally healthy since she was last seen. She has no other health concerns today other than previously mentioned.  Review of systems: Please see HPI for neurologic and other pertinent review of systems. Otherwise all other systems were reviewed and were negative.  Problem List: Patient Active Problem List   Diagnosis Date Noted  . 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 Iron deficiency anemia 02/01/2018  . Intermittent explosive disorder in adult 10/28/2016  . Performance anxiety 10/28/2016  . 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 Partial epilepsy with impairment of consciousness (HCC) 10/13/2012  . Episodic tension type headache 10/13/2012  . Migraine without aura 10/13/2012  . Migraine with aura, without mention of intractable migraine without mention of status migrainosus 10/13/2012  . Dysfunctions associated with sleep stages or arousal from sleep 10/13/2012  . Headache(784.0) 10/13/2012     Past Medical History:  Diagnosis Date  . Headache   . Seizure [facility])     Past medical history comments: See HPI Copied from previous record: patient was evaluated October 09, 2005 for headaches and insomnia. I concluded that she had migraine without aura and tension-type headaches. 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 She went to bed quite late and slept soundly for long periods of time. I recommended melatonin and possiblyClonidine as well as sleep hygiene.  I saw her again January 29, 2009 with history of headaches twice a week. She came home early on 2 occasions. I noted positive family history of migraines, a normal neurological examination, and a diagnosis of migraine without aura, migraine with aura and episodic tension type headaches. Mother mentioned that the patient had headaches and lost vision at times when her hair was being combed, often without changing position or elevating her head. These episodes were not associated with syncope because she did not lose consciousness nor fall.  She had onset of nocturnal seizures in February 10, 2011. This may have been preceded by an episode of twisting of her mouth associated clot laughter during an episode of flu in late November, 2012. CT scan of the brain February 05, 2011 was normal  without contrast. EEG February 11, 2011 was normal with the patient awake and drowsy.  Routine EEG in December 2013 was normal.Patient was hospitalized due to seizure activity in 2013.  A 48-hour ambulatory EEG showed 12  electrographic seizures lasting 45 to 102 seconds with a central 6 hertz theta range activity that localized to either side with secondary generalization and a generalized discharge to 20 hertz beta range activity shifting to 6 hertz generalized theta range activity followed by delta. Shifting frequencies convinced me that this was seizure activity as opposed to a rhythmic mid-temporal discharge.  She tapered off Lamotrigine in 2014.  She had problems with bedwetting between ages 54 and 39.  Birth History 7 lbs. 3 oz. infant born at term to a 54 year old gravida 3 para 2000 woman. Gestation was complicated by spotting mother recalls having "some heart trouble". She can't recall the details. She had excessive nausea during the first trimester and took an antiemetic. Labor lasted for 23 hours mother received epidural anesthesia. Normal spontaneous vaginal delivery. Delivery room was uncomplicated except for suctioning within her mouth. She did not require resuscitation.  Nursery course was uneventful. She went home with her mother.  Growth and development is recorded on the chart is normal.  Behavior History intermittent explosive anger, performance anxiety;as a child she would become upset easily which continued into her teen years  Surgical history: History reviewed. No pertinent surgical history.   Family history: family history includes ADD / ADHD in her cousin; Heart disease in her maternal grandfather; Hypertension in her maternal aunt and maternal grandfather; Migraines in her maternal grandmother, maternal uncle, and mother; Seizures in an other family member.   Social history: Social History   Socioeconomic History  . Marital status: Single    Spouse name: Not on file  . Number of children: Not on file  . Years of education: Not on file  . Highest education level: Not on file  Occupational History  . Not on file  Tobacco Use  . Smoking status: Never Smoker  .  Smokeless tobacco: Never Used  Substance and Sexual Activity  . Alcohol use: No  . Drug use: No  . Sexual activity: Never  Other Topics Concern  . 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   She attended [employer].   She lives with both parents. She has two older sisters.   She enjoys chilling, shopping, and hanging with friends.   Social Determinants of Health   Financial Resource Strain:   . Difficulty of Paying Living Expenses: Not on file  Food Insecurity:   . Worried About Programme researcher, broadcasting/film/video in the Last Year: Not on file  . Ran Out of Food in the Last Year: Not on file  Transportation Needs:   . Lack of Transportation (Medical): Not on file  . Lack of Transportation (Non-Medical): Not on file  Physical Activity:   . Days of Exercise per Week: Not on file  . Minutes of Exercise per Session: Not on file  Stress:   . Feeling of Stress : Not on file  Social Connections:   . Frequency of Communication with Friends and Family: Not on file  . Frequency of Social Gatherings with Friends and Family: Not on file  . Attends Religious Services: Not on file  . Active Member of Clubs or Organizations: Not on file  . Attends Banker Meetings: Not on file  . Marital Status: Not  on file  Intimate Partner Violence:   . Fear of Current or Ex-Partner: Not on file  . Emotionally Abused: Not on file  . Physically Abused: Not on file  . Sexually Abused: Not on file    Past/failed meds:  Allergies: Allergies  Allergen Reactions  . Oseltamivir Phosphate Other (See Comments)    Family relates connection between tamiflu and seizures     Immunizations:  There is no immunization history on file for this patient.    Diagnostics/Screenings: Copied from previous record: 10/30/17 - Sleep deprived EEG -This is anormalrecord with the patient awake, drowsy and asleep.A normal EEG does not rule out the presence of seizures. Ellison Carwin, MD  Physical Exam: BP 116/78   Pulse 76   Ht 5' 3.39" (1.61 m)   Wt 221 lb 6.4 oz (100.4 kg)   BMI 38.74 kg/m   General: Well developed, well nourished young woman, seated on exam table, in no evident distress, black hair, brown eyes, right handed Head: Head normocephalic and atraumatic.  Oropharynx benign. Neck: Supple Cardiovascular: Regular rate and rhythm, no murmurs Respiratory: Breath sounds clear to auscultation Musculoskeletal: No obvious deformities or scoliosis Skin: No rashes or neurocutaneous lesions  Neurologic Exam Mental Status: Awake and fully alert.  Oriented to place and time.  Recent and remote memory intact.  Attention span, concentration, and fund of knowledge appropriate.  Mood and affect appropriate. Cranial Nerves: Fundoscopic exam reveals sharp disc margins.  Pupils equal, briskly reactive to light.  Extraocular movements full without nystagmus.  Visual fields full to confrontation.  Hearing intact and symmetric to finger rub.  Facial sensation intact.  Face tongue, palate move normally and symmetrically.  Neck flexion and extension normal. Motor: Normal bulk and tone. Normal strength in all tested extremity muscles. Sensory: Intact to touch and temperature in all extremities.  Coordination: Rapid alternating movements normal in all extremities.  Finger-to-nose and heel-to shin performed accurately bilaterally.  Romberg negative. Gait and Station: Arises from chair without difficulty.  Stance is normal. Gait demonstrates normal stride length and balance.   Able to heel, toe and tandem walk without difficulty. Reflexes: 1+ and symmetric. Toes downgoing.  Impression: 1. Nocturnal seizures 2. Migraine without aura 3. Episodic tension headache 4. Iron deficiency anemia 5. Seasonal allergies  Recommendations for plan of care: The patient's previous [facility] records were reviewed. [place] has neither had nor required imaging since the last visit. She had lab  studies and is aware of the results. She is a 24 year old young woman with history of nocturnal seizures, migraine and tension headaches, iron deficiency anemia and seasonal allergies. She is taking and tolerating Lamotrigine for her seizure disorder. The dose was recently increased in September 2021 when her mother reported that she was having seizures during sleep. A Lamotrigine level was obtained and was noted to be sub-therapeutic. I recommended increase the dose and she has tolerated that well. I explained the need to repeat the blood test to assure that the level has increased and is not therapeutic. I gave her a blood test order and instructions on how to get that done.  I reminded her of the need for compliance with taking the medication, and the importance of staying on a regular sleep schedule. I asked Dmiya to let me know if she has more nocturnal seizures. I will see otherwise see her back in follow up in 3 months or sooner if needed.  The medication list was reviewed and reconciled. No changes were  made in the prescribed medications today. A complete medication list was provided to the patient.  Allergies as of 12/05/2019      Reactions   Oseltamivir Phosphate Other (See Comments)   Family relates connection between tamiflu and seizures      Medication List       Accurate as of December 05, 2019 11:59 PM. If you have any questions, ask your nurse or doctor.        lamoTRIgine 25 MG tablet Commonly known as: LAMICTAL Take 3 tablets twice per day   loratadine 5 MG/5ML syrup Commonly known as: CLARITIN loratadine 5 mg/5 mL oral solution  TAKE 10 MLS AT BEDTIME   polyethylene glycol powder 17 GM/SCOOP powder Commonly known as: GLYCOLAX/MIRALAX polyethylene glycol 3350 17 gram/dose oral powder  1 (ONE) CAPFUL, ORAL, DAILY   SODIUM FLUORIDE (DENTAL RINSE) 0.2 % Soln sodium fluoride 0.2 % dental solution  RINSE AND SPIT EVERY DAY AT BEDTIME   SUMAtriptan 25 MG  tablet Commonly known as: IMITREX Take 1 tablet at onset of migraine along with Ibuprofen 400mg . May repeat once in 2 hours if headache persists or recurs.       Total time spent with the patient was 20 minutes, of which 50% or more was spent in counseling and coordination of care.  Elveria Risingina Ricci Paff NP-C [facility] Ph. [phone] Fax [phone]

## 2019-12-09 ENCOUNTER — Encounter (INDEPENDENT_AMBULATORY_CARE_PROVIDER_SITE_OTHER): Payer: Self-pay | Admitting: Family

## 2019-12-09 DIAGNOSIS — Z79899 Other long term (current) drug therapy: Secondary | ICD-10-CM | POA: Insufficient documentation

## 2019-12-11 LAB — LAMOTRIGINE LEVEL: Lamotrigine Lvl: 5.6 ug/mL (ref 4.0–18.0)

## 2019-12-12 ENCOUNTER — Telehealth (INDEPENDENT_AMBULATORY_CARE_PROVIDER_SITE_OTHER): Payer: Self-pay | Admitting: Family

## 2019-12-12 NOTE — Telephone Encounter (Signed)
I called Olivia Castillo and reviewed the recent Lamotrigine level with her. I told her that the level was now therapeutic and to continue taking 3 tablets twice per day. I asked her to let me know if she has any seizures. Olivia Castillo agreed with this plan. TG

## 2019-12-19 ENCOUNTER — Telehealth (INDEPENDENT_AMBULATORY_CARE_PROVIDER_SITE_OTHER): Payer: Self-pay | Admitting: Family

## 2019-12-19 NOTE — Telephone Encounter (Signed)
I called and spoke with Mom. She said that Saturday night Olivia Castillo went to [place] with friends and cousins for a birthday celebration. She went out to club, vaped, used a hookah, used marijuana and may have had alcohol. She complained of a headache on way back to hotel at around 2-3AM, took Tylenol and went to bed. Shortly after going to bed friend woke up hearing her having seizure. She proceeded to have 5 seizures back to back and EMS was called. She had more seizures while being transported to hospital and in ER, and was intubated and admitted to ICU. Mom said that today she was extubated but that she has a fever, is confused and has no memory of events surrounding admission. Mom said that she tried to tell ICU attending about her history but that they may call here for more information. Mom said that when Olivia Castillo was discharged that she will call me for an appointment to see her after the hospitalization. TG

## 2019-12-19 NOTE — Telephone Encounter (Signed)
  Who's calling (name and relationship to patient) : Trudah (mom)  Best contact number: [phone]  Provider they see: Elveria Rising  / Dr. Sharene Skeans  Reason for call: Mom states that patient had multiple seizures and is in the ICU of [facility] in [place] on a ventilator. Mom would like to speak with Elveria Rising or Dr. Sharene Skeans.    PRESCRIPTION REFILL ONLY  Name of prescription:  Pharmacy:

## 2019-12-20 NOTE — Telephone Encounter (Signed)
Thank you for the information.  We need to make certain that she was compliant with her medications.  I am not certain why she would have had such marked seizure that she required intubation.  If I am here when she comes, please let me know.

## 2019-12-28 ENCOUNTER — Encounter (INDEPENDENT_AMBULATORY_CARE_PROVIDER_SITE_OTHER): Payer: Self-pay | Admitting: Family

## 2019-12-28 ENCOUNTER — Other Ambulatory Visit: Payer: Self-pay

## 2019-12-28 ENCOUNTER — Ambulatory Visit (INDEPENDENT_AMBULATORY_CARE_PROVIDER_SITE_OTHER): Payer: 59 | Admitting: Family

## 2019-12-28 VITALS — BP 116/72 | HR 92 | Wt 222.0 lb

## 2019-12-28 DIAGNOSIS — Z79899 Other long term (current) drug therapy: Secondary | ICD-10-CM | POA: Diagnosis not present

## 2019-12-28 DIAGNOSIS — N179 Acute kidney failure, unspecified: Secondary | ICD-10-CM | POA: Diagnosis not present

## 2019-12-28 DIAGNOSIS — G40309 Generalized idiopathic epilepsy and epileptic syndromes, not intractable, without status epilepticus: Secondary | ICD-10-CM | POA: Diagnosis not present

## 2019-12-28 DIAGNOSIS — G40209 Localization-related (focal) (partial) symptomatic epilepsy and epileptic syndromes with complex partial seizures, not intractable, without status epilepticus: Secondary | ICD-10-CM | POA: Diagnosis not present

## 2019-12-28 DIAGNOSIS — M6282 Rhabdomyolysis: Secondary | ICD-10-CM

## 2019-12-28 DIAGNOSIS — R748 Abnormal levels of other serum enzymes: Secondary | ICD-10-CM

## 2019-12-28 MED ORDER — BRIVIACT 50 MG PO TABS: ORAL_TABLET | ORAL | 1 refills | Status: DC

## 2019-12-28 MED ORDER — LAMOTRIGINE 100 MG PO TABS: ORAL_TABLET | ORAL | 1 refills | Status: DC

## 2019-12-28 NOTE — Patient Instructions (Addendum)
Thank you for coming in today.   Instructions for you until your next appointment are as follows: 1. Take the new medication - Brivact - 1 tablet twice per day  2. Take the Lamotrigine that you have from the doctor in [place] until the bottle is empty, then start Lamotrigine 100mg  as follows:  - take 1/2 tablet at bedtime for 2 weeks  - then take 1/2 tablet twice per day for 2 weeks  - then take 1 tablet twice per day after that  I sent in refills for both prescriptions  3. It is important not to miss any doses of your medications 4. Be sure to sleep on a regular schedule and get at least 8 hours of sleep each night 5. I wrote a letter to your employer for you to work only daytime shifts through January 14, 2020 6. I have given you lab orders to get blood drawn on Thursday January 05, 2020. You can come to this office or go to a [facility] laboratory 7. It is important for you to avoid drinking any alcohol, using marijuana, other drugs or vaping. These can interfere with your seizure medications and trigger seizures 8. You are not permitted to drive for the time being.  9.  Please sign up for MyChart if you have not done so 10. Please plan to return for follow up in 2 weeks or sooner if needed.

## 2019-12-28 NOTE — Progress Notes (Signed)
Olivia Castillo   MRN:  [id number]  01-Nov-1995   Provider: Elveria Rising NP-C Location of Care: [facility]  Visit type: Urgent return visit after hospitalization  Last visit: 12/05/2019  Referral source: Marcelle Overlie, MD History from: patient, her mother, Epic chart and records from recent hospitalization at [facility] in [place], [place]  Brief history:  Copied from previous record: History of headaches and seizures. She is taking and tolerating Lamotrigine for nocturnal seizures. She also has history of iron deficiency anemia.  Today's concerns: Olivia Castillo is seen today in follow up for hospitalization at [facility] in [place] on December 18, 2019 until December 26, 2019. On that day, she was in [place] with her cousins and friends. They went out to a club, where she was noted to have used marijuana, vaped, drank alcohol and was out until about 2AM. She complained of headache and then she and her companions returned to the hotel and went to bed. About a hour or so into sleep, she began having a seizures of seizures and 911 was summoned. She was taken to the ED where she continued to have back to back seizures. She required intubation to protect her airway and was admitted to ICU. There she gradually improved and was extubated the following day. She was noted to have CPK of 13,000 after receiving Keppra IV. She was treated with IV fluids and changed to Briviact. The Lamotrigine was stopped upon admission, and there was some additional concern about non-compliance. The Lamotrigine was then restarted with plans to titrate to therapeutic dose. She was discharged home on Briviact and Lamotrigine, with instructions to follow up at this office.   Olivia Castillo does not recall much of the events in [place]. Her mother has taken over administering her medications and uses a pill box to be sure that doses are not missed. Mom is very concerned about the rhabdomyolysis and  about reports of acute kidney injury while she was in ICU. Olivia Castillo was working prior to the seizure event but has been restricted from working by the providers in [place] for the next couple of weeks.   Olivia Castillo has been otherwise generally healthy since she was last seen. Neither she nor mother have other health concerns for her today other than previously mentioned.  Review of systems: Please see HPI for neurologic and other pertinent review of systems. Otherwise all other systems were reviewed and were negative.  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 I concluded that she had migraine without aura and tension-type headaches. 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 She went to bed quite late  and slept soundly for long periods of time. I recommended melatonin and possiblyClonidine as well as sleep hygiene.  I saw her again January 29, 2009 with history of headaches twice a week. She came home early on 2 occasions. I noted positive family history of  migraines, a normal neurological examination, and a diagnosis of migraine without aura, migraine with aura and episodic tension type headaches. Mother mentioned that the patient had headaches and lost vision at times when her hair was being combed, often without changing position or elevating her head. These episodes were not associated with syncope because she did not lose consciousness nor fall.  She had onset of nocturnal seizures in February 10, 2011. This may have been preceded by an episode of twisting of her mouth associated clot laughter during an episode of flu in late November, 2012. CT scan of the brain February 05, 2011 was normal without contrast. EEG February 11, 2011 was normal with the patient awake and drowsy.  Routine EEG in December 2013 was normal.Patient was hospitalized due to seizure activity in 2013.  A 48-hour ambulatory EEG showed 12 electrographic seizures lasting 45 to 102 seconds with a central 6 hertz theta range activity that localized to either side with secondary generalization and a generalized discharge to 20 hertz beta range activity shifting to 6 hertz generalized theta range activity followed by delta. Shifting frequencies convinced me that this was seizure activity as opposed to a rhythmic mid-temporal discharge.  She tapered off Lamotrigine in 2014.  She had problems with bedwetting between ages 89 and 89.  Birth History 7 lbs. 3 oz. infant born at term to a 24 year old gravida 3 para 2000 woman. Gestation was complicated by spotting mother recalls having "some heart trouble". She can't recall the details. She had excessive nausea during the first trimester and took an antiemetic. Labor lasted for 23 hours mother received epidural anesthesia. Normal spontaneous vaginal delivery. Delivery room was uncomplicated except for suctioning within her mouth. She did not require resuscitation.  Nursery course was uneventful. She went home with her mother.   Growth and development is recorded on the chart is normal.  Behavior History intermittent explosive anger, performance anxiety;as a child she would become upset easily which continued into her teen years   Surgical history: No past surgical history on file.   Family history: family history includes ADD / ADHD in her cousin; Heart disease in her maternal grandfather; Hypertension in her maternal aunt and maternal grandfather; Migraines in her maternal grandmother, maternal uncle, and mother; Seizures in an other family member.   Social history: Social History   Socioeconomic History   Marital status: Single    Spouse name: Not on file   Number of children: Not on file   Years of education: Not on file   Highest education level: Not on file  Occupational History   Not on file  Tobacco Use   Smoking status: Never Smoker   Smokeless tobacco: Never Used  Substance and Sexual Activity   Alcohol use: No   Drug use: No   Sexual activity: Never  Other Topics Concern   Not on file  Social History Narrative   Olivia Castillo is a high [employer] graduate.   She attended [employer].   She lives with both parents. She has two older sisters.   She enjoys chilling, shopping, and hanging with friends.   Social Determinants of Health   Financial Resource Strain:    Difficulty of Paying Living Expenses: Not on  file  Food Insecurity:    Worried About Programme researcher, broadcasting/film/video in the Last Year: Not on file   [employer] of Food in the Last Year: Not on file  Transportation Needs:    Lack of Transportation (Medical): Not on file   Lack of Transportation (Non-Medical): Not on file  Physical Activity:    Days of Exercise per Week: Not on file   Minutes of Exercise per Session: Not on file  Stress:    Feeling of Stress : Not on file  Social Connections:    Frequency of Communication with Friends and Family: Not on file   Frequency of Social Gatherings with Friends and Family:  Not on file   Attends Religious Services: Not on file   Active Member of Clubs or Organizations: Not on file   Attends Banker Meetings: Not on file   Marital Status: Not on file  Intimate Partner Violence:    Fear of Current or Ex-Partner: Not on file   Emotionally Abused: Not on file   Physically Abused: Not on file   Sexually Abused: Not on file    Past/failed meds: Keppra - rhabdomyolysis  Allergies: Allergies  Allergen Reactions   Keppra [Levetiracetam] Other (See Comments)    Rhabdomyolysis   Oseltamivir Phosphate Other (See Comments)    Family relates connection between tamiflu and seizures    Immunizations:  There is no immunization history on file for this patient.   Diagnostics/Screenings: Copied from previous record: 10/30/17 - Sleep deprived EEG -This is anormalrecord with the patient awake, drowsy and asleep.A normal EEG does not rule out the presence of seizures. Olivia Carwin, MD  12/19/2019 - MRI Brain wwo contrast ([facility], [place]) - normal  12/19/2019 - Continuous EEG ([facility] [place]) - Improving, initially severe and ultimately mild to moderate diffuse encephalopathy, but non-specific as to etiology. No epileptiform discharges, no seizures, no focal or lateralizing signs are seen  Physical Exam: BP 116/72    Pulse 92    Wt 222 lb 0.1 oz (100.7 kg)    BMI 38.85 kg/m   General: Well developed, well nourished obese young woman, seated on exam table, in no evident distress, black hair, brown eyes, right handed Head: Head normocephalic and atraumatic.  Oropharynx benign. Neck: Supple Cardiovascular: Regular rate and rhythm, no murmurs Respiratory: Breath sounds clear to auscultation Musculoskeletal: No obvious deformities or scoliosis Skin: No rashes or neurocutaneous lesions  Neurologic Exam Mental Status: Awake and fully alert.  Oriented to place and time.  Recent and remote memory intact.  Attention  span, concentration, and fund of knowledge appropriate.  Mood and affect appropriate. Cranial Nerves: Fundoscopic exam reveals sharp disc margins.  Pupils equal, briskly reactive to light.  Extraocular movements full without nystagmus.  Visual fields full to confrontation.  Hearing intact and symmetric to finger rub.  Facial sensation intact.  Face tongue, palate move normally and symmetrically.  Neck flexion and extension normal. Motor: Normal bulk and tone. Normal strength in all tested extremity muscles. Sensory: Intact to touch and temperature in all extremities.  Coordination: Rapid alternating movements normal in all extremities.  Finger-to-nose and heel-to shin performed accurately bilaterally.  Romberg negative. Gait and Station: Arises from chair without difficulty.  Stance is normal. Gait demonstrates normal stride length and balance.   Able to heel, toe and tandem walk without difficulty. Reflexes: 1+ and symmetric. Toes downgoing.  Impression: 1. Nocturnal seizures 2. Migraine without aura 3. Episodic tension headaches 4.  Iron deficiency anemia 5. Seasonal allergies 6. Problems with medication and treatment plan noncompliance  Recommendations for plan of care: The patient's previous [facility] records were reviewed. Keniesha is a 24 year old young woman who is seen today in follow up for recent hospitalization in August for breakthrough seizures in the setting of probable noncompliance, sleep deprivation and use of substances. She suffered rhabdomyolysis after administration of Keppra and is now taking Briviact and Lamotrigine. I talked with Olivia Castillo and her mother about the medications and reviewed doses. I asked her to come in to the office in 1 week to get blood drawn to recheck CPK and renal function. I encouraged her to continue to drink fluids liberally and to get at least 8 hours of sleep each night. I wrote a letter to her employer for her to work only day shift for a few weeks when  she returns to work so that she can get adequate sleep. I talked with Olivia Castillo about the importance of compliance with her treatment plan and about avoiding known triggers such as alcohol and drugs. I will see Olivia Castillo back in follow up in 2 weeks or sooner if needed. She and her mother agreed with the plans made today.   The medication list was reviewed and reconciled. I reviewed changes that were made in the prescribed medications today. A complete medication list was provided to the patient.  Allergies as of 12/28/2019      Reactions   Keppra [levetiracetam] Other (See Comments)   Rhabdomyolysis   Oseltamivir Phosphate Other (See Comments)   Family relates connection between tamiflu and seizures      Medication List       Accurate as of December 28, 2019 11:59 PM. If you have any questions, ask your nurse or doctor.        Briviact 50 MG Tabs Generic drug: Brivaracetam Take 1 tablet twice per day What changed:   how much to take  how to take this  when to take this  additional instructions Changed by: Elveria Rising, NP   lamoTRIgine 100 MG tablet Commonly known as: LAMICTAL Take 1/2 tablet at bedtime for 2 weeks, then take 1/2 tablet twice per day for 2 weeks, then take 1 tablet twice per day What changed:   medication strength  additional instructions Changed by: Elveria Rising, NP   loratadine 5 MG/5ML syrup Commonly known as: CLARITIN loratadine 5 mg/5 mL oral solution  TAKE 10 MLS AT BEDTIME   polyethylene glycol powder 17 GM/SCOOP powder Commonly known as: GLYCOLAX/MIRALAX polyethylene glycol 3350 17 gram/dose oral powder  1 (ONE) CAPFUL, ORAL, DAILY   SODIUM FLUORIDE (DENTAL RINSE) 0.2 % Soln sodium fluoride 0.2 % dental solution  RINSE AND SPIT EVERY DAY AT BEDTIME   SUMAtriptan 25 MG tablet Commonly known as: IMITREX Take 1 tablet at onset of migraine along with Ibuprofen 400mg . May repeat once in 2 hours if headache persists or recurs.       I consulted with Dr regarding this patient.  Total time spent with the patient was 45 minutes, of which 50% or more was spent in counseling and coordination of care, as well as review of records from hospitalization in [place], [place].  [place] NP-C [facility] Ph. [phone] Fax [phone]

## 2020-01-05 ENCOUNTER — Encounter (INDEPENDENT_AMBULATORY_CARE_PROVIDER_SITE_OTHER): Payer: Self-pay | Admitting: Family

## 2020-01-05 DIAGNOSIS — R748 Abnormal levels of other serum enzymes: Secondary | ICD-10-CM | POA: Insufficient documentation

## 2020-01-05 DIAGNOSIS — M6282 Rhabdomyolysis: Secondary | ICD-10-CM | POA: Insufficient documentation

## 2020-01-05 DIAGNOSIS — N179 Acute kidney failure, unspecified: Secondary | ICD-10-CM | POA: Insufficient documentation

## 2020-01-09 ENCOUNTER — Telehealth (INDEPENDENT_AMBULATORY_CARE_PROVIDER_SITE_OTHER): Payer: Self-pay | Admitting: Family

## 2020-01-09 NOTE — Telephone Encounter (Signed)
Who's calling (name and relationship to patient) : Lysbeth Galas   Best contact number: [phone]  Provider they see: Elveria Rising   Reason for call: Would like to know if she got blood work done tomorrow would provider have the results in time for Wednesday appointment.   Would also like to be sure that lab orders can be printed easily from [place] office.   Call ID:      PRESCRIPTION REFILL ONLY  Name of prescription:  Pharmacy:

## 2020-01-09 NOTE — Telephone Encounter (Signed)
L/M informing patient that she can get her labs done in the morning. Informed her that she is to fast and also stated that she should not take her meds. Invited her to call back.

## 2020-01-11 ENCOUNTER — Other Ambulatory Visit: Payer: Self-pay

## 2020-01-11 ENCOUNTER — Encounter (INDEPENDENT_AMBULATORY_CARE_PROVIDER_SITE_OTHER): Payer: Self-pay | Admitting: Family

## 2020-01-11 ENCOUNTER — Ambulatory Visit (INDEPENDENT_AMBULATORY_CARE_PROVIDER_SITE_OTHER): Payer: 59 | Admitting: Family

## 2020-01-11 VITALS — BP 110/74 | HR 78 | Ht 62.21 in | Wt 217.2 lb

## 2020-01-11 DIAGNOSIS — G40309 Generalized idiopathic epilepsy and epileptic syndromes, not intractable, without status epilepticus: Secondary | ICD-10-CM | POA: Diagnosis not present

## 2020-01-11 DIAGNOSIS — M6282 Rhabdomyolysis: Secondary | ICD-10-CM

## 2020-01-11 DIAGNOSIS — R748 Abnormal levels of other serum enzymes: Secondary | ICD-10-CM

## 2020-01-11 DIAGNOSIS — N179 Acute kidney failure, unspecified: Secondary | ICD-10-CM

## 2020-01-11 DIAGNOSIS — G40209 Localization-related (focal) (partial) symptomatic epilepsy and epileptic syndromes with complex partial seizures, not intractable, without status epilepticus: Secondary | ICD-10-CM | POA: Diagnosis not present

## 2020-01-11 DIAGNOSIS — G43009 Migraine without aura, not intractable, without status migrainosus: Secondary | ICD-10-CM | POA: Diagnosis not present

## 2020-01-11 DIAGNOSIS — G44219 Episodic tension-type headache, not intractable: Secondary | ICD-10-CM | POA: Diagnosis not present

## 2020-01-11 NOTE — Progress Notes (Signed)
Olivia Castillo   MRN:  [id number]  Feb 26, 1995   Provider: Elveria Rising NP-C Location of Care: [facility]  Visit type: Follow Up  Last visit: 12/28/2019  Referral source: Marcelle Overlie, MD History from: Olivia Castillo, [facility] Chart, mom  Brief history:  Copied from previous record: History of headaches and seizures. Olivia Castillo is taking and tolerating Briviact and Lamotrigine for nocturnal seizures. Olivia Castillo also has history of iron deficiency anemia.Olivia Castillo was hospitalized in [place], [place] in October 2021 for status epilepticus in the setting of probable missed doses and sleep deprivation. Olivia Castillo had rhabdomyolysis related to IV Keppra administration and acute renal injury during that hospitalization.   Today's concerns: Olivia Castillo reports today that Olivia Castillo has remained seizure free since her last visit. Her mother has been administering her medication to be sure that Olivia Castillo is compliant. 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 Olivia Castillo has been restricted from driving due to the recent seizure event in October.  Olivia Castillo had blood drawn yesterday to recheck CPK, renal function and Lamotrigine level. Olivia Castillo has been otherwise generally healthy since Olivia Castillo was last seen. Neither Olivia Castillo nor her mother have other health concerns for her today other than previously mentioned.  Review of systems: Please see HPI for neurologic and other pertinent review of systems. Otherwise all other systems were reviewed and were negative.  Problem List: Olivia Castillo Active Problem List   Diagnosis Date Noted  . Acute kidney injury (HCC) 01/05/2020  . Non-traumatic rhabdomyolysis 01/05/2020  . Elevated CPK 01/05/2020  . Encounter for long-term (current) use of high-risk medication 12/09/2019  . 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 Iron deficiency anemia 02/01/2018  . Intermittent explosive disorder in adult 10/28/2016  . Performance  anxiety 10/28/2016  . 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 Partial epilepsy with impairment of consciousness (HCC) 10/13/2012  . Episodic tension type headache 10/13/2012  . Migraine without aura 10/13/2012  . Migraine with aura, without mention of intractable migraine without mention of status migrainosus 10/13/2012  . Dysfunctions associated with sleep stages or arousal from sleep 10/13/2012  . Headache(784.0) 10/13/2012     Past Medical History:  Diagnosis Date  . Headache   . Seizure [facility])     Past medical history comments: See HPI Copied from previous record: Olivia Castillo was evaluated October 09, 2005 for headaches and insomnia. I concluded that Olivia Castillo had migraine without aura and tension-type headaches. 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 Olivia Castillo went to bed quite late and slept soundly for long periods of time. I recommended melatonin and possiblyClonidine as well as sleep hygiene.  I saw her again January 29, 2009 with history of headaches twice a week. Olivia Castillo came home early on 2 occasions. I noted positive family history of migraines, a normal neurological examination, and a diagnosis of migraine without aura, migraine with aura and episodic tension type headaches. Mother mentioned that the Olivia Castillo had headaches and lost vision at times when her hair was being combed, often without changing position or elevating her head. These episodes were not associated with syncope because Olivia Castillo did not lose consciousness nor fall.  Olivia Castillo had onset of nocturnal seizures in February 10, 2011. This may have been preceded by an episode of twisting of her mouth associated clot laughter during an episode of flu in late November, 2012. CT scan of the brain February 05, 2011 was normal  without contrast. EEG February 11, 2011 was normal with the Olivia Castillo awake and drowsy.  Routine EEG in December 2013 was normal.Olivia Castillo was hospitalized due to seizure activity in  2013.  A 48-hour ambulatory EEG showed 12 electrographic seizures lasting 45 to 102 seconds with a central 6 hertz theta range activity that localized to either side with secondary generalization and a generalized discharge to 20 hertz beta range activity shifting to 6 hertz generalized theta range activity followed by delta. Shifting frequencies convinced me that this was seizure activity as opposed to a rhythmic mid-temporal discharge.  Olivia Castillo tapered off Lamotrigine in 2014.  Olivia Castillo had problems with bedwetting between ages 9 and 24.  Birth History 7 lbs. 3 oz. infant born at term to a 53 year old gravida 3 para 2000 woman. Gestation was complicated by spotting mother recalls having "some heart trouble". Olivia Castillo can't recall the details. Olivia Castillo had excessive nausea during the first trimester and took an antiemetic. Labor lasted for 23 hours mother received epidural anesthesia. Normal spontaneous vaginal delivery. Delivery room was uncomplicated except for suctioning within her mouth. Olivia Castillo did not require resuscitation.  Nursery course was uneventful. Olivia Castillo went home with her mother.  Growth and development is recorded on the chart is normal.  Behavior History intermittent explosive anger, performance anxiety;as a child Olivia Castillo would become upset easily which continued into her teen years  Surgical history: History reviewed. No pertinent surgical history.   Family history: family history includes ADD / ADHD in her cousin; Heart disease in her maternal grandfather; Hypertension in her maternal aunt and maternal grandfather; Migraines in her maternal grandmother, maternal uncle, and mother; Seizures in an other family member.   Social history: Social History   Socioeconomic History  . Marital status: Single    Spouse name: Not on file  . Number of children: Not on file  . Years of education: Not on file  . Highest education level: Not on file  Occupational History  . Not on file  Tobacco Use    . Smoking status: Never Smoker  . Smokeless tobacco: Never Used  Substance and Sexual Activity  . Alcohol use: No  . Drug use: No  . Sexual activity: Never  Other Topics Concern  . 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   Olivia Castillo attended [employer].   Olivia Castillo lives with both parents. Olivia Castillo has two older sisters.   Olivia Castillo enjoys chilling, shopping, and hanging with friends.   Social Determinants of Health   Financial Resource Strain:   . Difficulty of Paying Living Expenses: Not on file  Food Insecurity:   . Worried About Programme researcher, broadcasting/film/video in the Last Year: Not on file  . Ran Out of Food in the Last Year: Not on file  Transportation Needs:   . Lack of Transportation (Medical): Not on file  . Lack of Transportation (Non-Medical): Not on file  Physical Activity:   . Days of Exercise per Week: Not on file  . Minutes of Exercise per Session: Not on file  Stress:   . Feeling of Stress : Not on file  Social Connections:   . Frequency of Communication with Friends and Family: Not on file  . Frequency of Social Gatherings with Friends and Family: Not on file  . Attends Religious Services: Not on file  . Active Member of Clubs or Organizations: Not on file  . Attends Banker Meetings: Not on file  . Marital Status:  Not on file  Intimate Partner Violence:   . Fear of Current or Ex-Partner: Not on file  . Emotionally Abused: Not on file  . Physically Abused: Not on file  . Sexually Abused: Not on file   Past/failed meds: Copied from previous record: Keppra - rhabdomyolysis  Allergies: Allergies  Allergen Reactions  . Keppra [Levetiracetam] Other (See Comments)    Rhabdomyolysis  . Oseltamivir Phosphate Other (See Comments)    Family relates connection between tamiflu and seizures    Immunizations: Immunization History  Administered Date(s) Administered  . Influenza,inj,Quad PF,6+ Mos 12/31/2017     Diagnostics/Screenings: Copied from previous record: 10/30/17 - Sleep deprived EEG -This is anormalrecord with the Olivia Castillo awake, drowsy and asleep.A normal EEG does not rule out the presence of seizures. Ellison CarwinWilliam Hickling, MD  12/19/2019 - [place] wwo contrast [place] - normal  12/19/2019 - Continuous EEG ([facility] [place]) - Improving, initially severe and ultimately mild to moderate diffuse encephalopathy, but non-specific as to etiology. No epileptiform discharges, no seizures, no focal or lateralizing signs are seen  Physical Exam: BP 110/74   Pulse 78   Ht 5' 2.21" (1.58 m)   Wt 217 lb 2.5 oz (98.5 kg)   BMI 39.46 kg/m   General: Well developed, well nourished young woman, seated on exam table, in no evident distress, black hair, brown eyes, right handed Head: Head normocephalic and atraumatic.  Oropharynx benign. Neck: Supple Cardiovascular: Regular rate and rhythm, no murmurs Respiratory: Breath sounds clear to auscultation Musculoskeletal: No obvious deformities or scoliosis Skin: No rashes or neurocutaneous lesions  Neurologic Exam Mental Status: Awake and fully alert.  Oriented to place and time.  Recent and remote memory intact.  Attention span, concentration, and fund of knowledge appropriate.  Mood and affect appropriate. Cranial Nerves: Fundoscopic exam reveals sharp disc margins.  Pupils equal, briskly reactive to light.  Extraocular movements full without nystagmus.  Visual fields full to confrontation.  Hearing intact and symmetric to finger rub.  Facial sensation intact.  Face tongue, palate move normally and symmetrically.  Neck flexion and extension normal. Motor: Normal bulk and tone. Normal strength in all tested extremity muscles. Sensory: Intact to touch and temperature in all extremities.  Coordination: Rapid alternating movements normal in all extremities.  Finger-to-nose and heel-to shin performed accurately  bilaterally.  Romberg negative. Gait and Station: Arises from chair without difficulty.  Stance is normal. Gait demonstrates normal stride length and balance.   Able to heel, toe and tandem walk without difficulty. Reflexes: 1+ and symmetric. Toes downgoing.  Impression: 1. Nocturnal seizures 2. Migraine without aura 3. Episodic tension headache 4. Iron deficiency anemia 5. Seasonal allergies 6. Problems with medication and treatment plan compliance with recent status epilepticus event in October 2021  Recommendations for plan of care: The [facility]. [name] has neither had nor required imaging since the last visit. Olivia Castillo had lab studies done, which revealed normal CPK and normal renal function. The Lamotrigine level is pending. [name] is a 24 year old woman with history of nocturnal seizures, and recent status epilepticus event in October 2021. During that event Olivia Castillo suffered rhabdomyolysis after administration of IV Keppra. Olivia Castillo also suffered acute renal injury. Her CPK and renal function lab studies have normalized. Olivia Castillo has been compliant with medication and with getting enough sleep since her last visit. I reminded her of the importance of these things in achieving seizure control. I will call Shanin when I receive  the Lamotrigine level. I reminded her that Olivia Castillo is not permitted to drive a this time but I will consider it when Olivia Castillo returns in January if Olivia Castillo remains seizure free. Candies brought a form for her employer. I will complete it and fax it to her employer as requested. Jacques and her mother agreed with the plans made today.   The medication list was reviewed and reconciled. No changes were made in the prescribed medications today. A complete medication list was provided to the Olivia Castillo.  Allergies as of 01/11/2020      Reactions   Keppra [levetiracetam] Other (See Comments)   Rhabdomyolysis   Oseltamivir Phosphate Other (See Comments)   Family  relates connection between tamiflu and seizures      Medication List       Accurate as of January 11, 2020 12:02 PM. If you have any questions, ask your nurse or doctor.        Briviact 50 MG Tabs Generic drug: Brivaracetam Take 1 tablet twice per day   lamoTRIgine 100 MG tablet Commonly known as: LAMICTAL Take 1/2 tablet at bedtime for 2 weeks, then take 1/2 tablet twice per day for 2 weeks, then take 1 tablet twice per day   loratadine 5 MG/5ML syrup Commonly known as: CLARITIN loratadine 5 mg/5 mL oral solution  TAKE 10 MLS AT BEDTIME   polyethylene glycol powder 17 GM/SCOOP powder Commonly known as: GLYCOLAX/MIRALAX polyethylene glycol 3350 17 gram/dose oral powder  1 (ONE) CAPFUL, ORAL, DAILY   SODIUM FLUORIDE (DENTAL RINSE) 0.2 % Soln sodium fluoride 0.2 % dental solution  RINSE AND SPIT EVERY DAY AT BEDTIME   SUMAtriptan 25 MG tablet Commonly known as: IMITREX Take 1 tablet at onset of migraine along with Ibuprofen 400mg . May repeat once in 2 hours if headache persists or recurs.       Return in about 2 months (around 03/12/2020).  Total time spent with the Olivia Castillo was 25 minutes, of which 50% or more was spent in counseling and coordination of care.  03/14/2020 NP-C [facility] Ph. [phone] Fax [phone]

## 2020-01-12 LAB — COMPREHENSIVE METABOLIC PANEL
AG Ratio: 1.6 (calc) (ref 1.0–2.5)
ALT: 12 U/L (ref 6–29)
AST: 14 U/L (ref 10–30)
Albumin: 4.1 g/dL (ref 3.6–5.1)
Alkaline phosphatase (APISO): 60 U/L (ref 31–125)
BUN: 9 mg/dL (ref 7–25)
CO2: 24 mmol/L (ref 20–32)
Calcium: 9.5 mg/dL (ref 8.6–10.2)
Chloride: 103 mmol/L (ref 98–110)
Creat: 0.7 mg/dL (ref 0.50–1.10)
Globulin: 2.5 g/dL (calc) (ref 1.9–3.7)
Glucose, Bld: 91 mg/dL (ref 65–99)
Potassium: 4.5 mmol/L (ref 3.5–5.3)
Sodium: 136 mmol/L (ref 135–146)
Total Bilirubin: 0.6 mg/dL (ref 0.2–1.2)
Total Protein: 6.6 g/dL (ref 6.1–8.1)

## 2020-01-12 LAB — LAMOTRIGINE LEVEL: Lamotrigine Lvl: 1.3 ug/mL — ABNORMAL LOW (ref 4.0–18.0)

## 2020-01-12 LAB — CK: Total CK: 123 U/L (ref 29–143)

## 2020-01-13 ENCOUNTER — Telehealth (INDEPENDENT_AMBULATORY_CARE_PROVIDER_SITE_OTHER): Payer: Self-pay | Admitting: Family

## 2020-01-13 ENCOUNTER — Encounter (INDEPENDENT_AMBULATORY_CARE_PROVIDER_SITE_OTHER): Payer: Self-pay | Admitting: Family

## 2020-01-13 NOTE — Telephone Encounter (Signed)
I left a message for Olivia Castillo and her mother. I will call them again on Monday January 16, 2020 regarding the Lamotrigine level. TG

## 2020-01-13 NOTE — Patient Instructions (Signed)
Thank you for coming in today.   Instructions for you until your next appointment are as follows: 1. Continue your medications as prescribed  2. Let me know if you have any seizures 3. Your blood tests to look at muscle and kidney injury are normal today. The Lamotrigine level is still pending. I will call you when I receive the results.  4. Remember that it is important for you to not miss any medication doses and to get at least 8 hours of sleep each night as these things are known to help with seizure control.  5. Remember that you are not permitted to drive at this time. We may consider it in January if you continue to be seizure free 6. I will complete the form for your work and send it to them 7. Please sign up for MyChart if you have not done so 8. Please plan to return for follow up in 2 months or sooner if needed.

## 2020-01-16 NOTE — Telephone Encounter (Signed)
I called lab results to Mom. I told her that we will recheck the Lamotrigine level prior to her next appointment. Mom also reported that Olivia Castillo fainted last night. She was standing and suddenly slumped to the floor. She told Mom later that she hadn't felt well during the day and that she suddenly felt hot, then lost consciousness. I talked with Mom about the event and recommended increased fluid intake and asked Mom to let me know if [name] has more fainting spells. Mom agreed with this plan. TG

## 2020-02-07 ENCOUNTER — Telehealth (INDEPENDENT_AMBULATORY_CARE_PROVIDER_SITE_OTHER): Payer: Self-pay | Admitting: Family

## 2020-02-07 NOTE — Telephone Encounter (Signed)
  Who's calling (name and relationship to patient) : Trudah (mom)  Best contact number: [phone]  Provider they see: Elveria Rising  Reason for call: Mom states that patient is having some issues that she would like to discuss with Inetta Fermo. Requests call back from [place].    PRESCRIPTION REFILL ONLY  Name of prescription:  Pharmacy:

## 2020-02-07 NOTE — Telephone Encounter (Signed)
I called and spoke with the patient and her mother. Olivia Castillo reports that her employer has been scheduling her for various shifts each day, despite asking to work day shift because of her medical condition. She reports being tired from working at night, and not sleeping well after getting home late. 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 Olivia Castillo asked for a letter to give to her employer to request that she work day shift each day, and that she does not work later than 6pm each day so that she can get adequate sleep at night. I agreed to write the letter. Olivia Castillo will pick it up later today. TG

## 2020-02-22 ENCOUNTER — Other Ambulatory Visit (INDEPENDENT_AMBULATORY_CARE_PROVIDER_SITE_OTHER): Payer: Self-pay | Admitting: Family

## 2020-02-22 DIAGNOSIS — G40309 Generalized idiopathic epilepsy and epileptic syndromes, not intractable, without status epilepticus: Secondary | ICD-10-CM

## 2020-02-22 DIAGNOSIS — G40209 Localization-related (focal) (partial) symptomatic epilepsy and epileptic syndromes with complex partial seizures, not intractable, without status epilepticus: Secondary | ICD-10-CM

## 2020-02-22 MED ORDER — LAMOTRIGINE 100 MG PO TABS: ORAL_TABLET | ORAL | 1 refills | Status: DC

## 2020-02-22 MED ORDER — BRIVIACT 50 MG PO TABS: ORAL_TABLET | ORAL | 1 refills | Status: DC

## 2020-02-22 NOTE — Telephone Encounter (Signed)
Who's calling (name and relationship to patient) : Trudah Handley mom   Best contact number: [phone]  Provider they see: Elveria Rising  Reason for call: Medications only have three doses left.   Call ID:      PRESCRIPTION REFILL ONLY  Name of prescription: Lamotrigine briviact  Pharmacy: [facility] [address]

## 2020-02-22 NOTE — Telephone Encounter (Signed)
Please send rx to the pharmacy 

## 2020-03-07 ENCOUNTER — Other Ambulatory Visit: Payer: Self-pay

## 2020-03-07 ENCOUNTER — Encounter (INDEPENDENT_AMBULATORY_CARE_PROVIDER_SITE_OTHER): Payer: Self-pay | Admitting: Family

## 2020-03-07 ENCOUNTER — Ambulatory Visit (INDEPENDENT_AMBULATORY_CARE_PROVIDER_SITE_OTHER): Payer: 59 | Admitting: Family

## 2020-03-07 VITALS — BP 112/78 | HR 88 | Ht 63.0 in | Wt 227.8 lb

## 2020-03-07 DIAGNOSIS — F6381 Intermittent explosive disorder: Secondary | ICD-10-CM

## 2020-03-07 DIAGNOSIS — G44219 Episodic tension-type headache, not intractable: Secondary | ICD-10-CM | POA: Diagnosis not present

## 2020-03-07 DIAGNOSIS — G40209 Localization-related (focal) (partial) symptomatic epilepsy and epileptic syndromes with complex partial seizures, not intractable, without status epilepticus: Secondary | ICD-10-CM | POA: Diagnosis not present

## 2020-03-07 DIAGNOSIS — G40309 Generalized idiopathic epilepsy and epileptic syndromes, not intractable, without status epilepticus: Secondary | ICD-10-CM

## 2020-03-07 DIAGNOSIS — G43009 Migraine without aura, not intractable, without status migrainosus: Secondary | ICD-10-CM | POA: Diagnosis not present

## 2020-03-07 MED ORDER — BRIVIACT 50 MG PO TABS: ORAL_TABLET | ORAL | 5 refills | Status: DC

## 2020-03-07 MED ORDER — LAMOTRIGINE 100 MG PO TABS: ORAL_TABLET | ORAL | 5 refills | Status: DC

## 2020-03-07 NOTE — Progress Notes (Signed)
Olivia Castillo   MRN:  [id number]  Aug 02, 1995   Provider: Elveria Risingina Goodpasture NP-C [facility] Care[facility]  Visit type: Routine Follow-Up  Last visit: 01/11/2020  Referral source: Olivia EdelmanMichelle Grewel, MD History from: patient, chcn chart, mom  Brief history:  Copied from previous record: History of headaches and seizures. She is taking and tolerating Briviact and Lamotrigine for nocturnal seizures. She also has history of iron deficiency anemia.She was [place] in [place], [place] in October 2021 for status epilepticus in the setting of probable missed doses and sleep deprivation. She had rhabdomyolysis related to IV Keppra administration and acute renal injury during that hospitalization.   Today's concerns: [name] reports today that she has remained seizure free since October 2021. Her mother has been monitoring her medication administration and she has not missed any doses since that seizure. [name] reports that she has been getting regular sleep at night. She also reports that she quit her job at [employer], and is applying to work at the [employer] distribution center.   Mom has concerns about [name]'s temper, being easily frustrated and easily angered. She says that she occasionally lashes out verbally at her family when upset.   [name] has been otherwise generally healthy since she was last seen. Neither she nor mother have other health concerns for her today other than previously mentioned.  Review of systems: Please see HPI for neurologic and other pertinent review of systems. Otherwise all other systems were reviewed and were negative.  Problem List: Patient Active Problem List   Diagnosis Date Noted  . Acute kidney injury (HCC) 01/05/2020  . Non-traumatic rhabdomyolysis 01/05/2020  . Elevated CPK 01/05/2020  . Encounter for long-term (current) use of high-risk medication 12/09/2019  . 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 Iron  deficiency anemia 02/01/2018  . Intermittent explosive disorder in adult 10/28/2016  . Performance anxiety 10/28/2016  . 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 Partial epilepsy with impairment of consciousness (HCC) 10/13/2012  . Episodic tension type headache 10/13/2012  . Migraine without aura 10/13/2012  . Migraine with aura, without mention of intractable migraine without mention of status migrainosus 10/13/2012  . Dysfunctions associated with sleep stages or arousal from sleep 10/13/2012  . Headache(784.0) 10/13/2012     Past Medical History:  Diagnosis Date  . Headache   . Seizure [facility](HCC)     Past medical history comments: See HPI Copied from previous record: patient was evaluated October 09, 2005 for headaches and insomnia. I concluded that she had migraine without aura and tension-type headaches. 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 She went to bed quite late and slept soundly for long periods of time. I recommended melatonin and possiblyClonidine as well as sleep hygiene.  I saw her again January 29, 2009 with history of headaches twice a week. She came home early on 2 occasions. I noted positive family history of migraines, a normal neurological examination, and a diagnosis of migraine without aura, migraine with aura and episodic tension type headaches. Mother mentioned that the patient had headaches and lost vision at times when her hair was being combed, often without changing position or elevating her head. These episodes were not associated with syncope because she did not lose consciousness nor fall.  She had onset of nocturnal seizures in February 10, 2011. This may have been preceded by an episode of twisting of her mouth associated clot laughter during an episode of flu in late November,  2012. CT scan of the brain February 05, 2011 was normal without contrast. EEG February 11, 2011 was normal with the patient awake and  drowsy.  Routine EEG in December 2013 was normal.Patient was hospitalized due to seizure activity in 2013.  A 48-hour ambulatory EEG showed 12 electrographic seizures lasting 45 to 102 seconds with a central 6 hertz theta range activity that localized to either side with secondary generalization and a generalized discharge to 20 hertz beta range activity shifting to 6 hertz generalized theta range activity followed by delta. Shifting frequencies convinced me that this was seizure activity as opposed to a rhythmic mid-temporal discharge.  She tapered off Lamotrigine in 2014.  She had problems with bedwetting between ages 62 and 43.  Birth History 7 lbs. 3 oz. infant born at term to a 13 year old gravida 3 para 2000 woman. Gestation was complicated by spotting mother recalls having "some heart trouble". She can't recall the details. She had excessive nausea during the first trimester and took an antiemetic. Labor lasted for 23 hours mother received epidural anesthesia. Normal spontaneous vaginal delivery. Delivery room was uncomplicated except for suctioning within her mouth. She did not require resuscitation.  Nursery course was uneventful. She went home with her mother.  Growth and development is recorded on the chart is normal.  Surgical history: No past surgical history on file.   Family history: family history includes ADD / ADHD in her cousin; Heart disease in her maternal grandfather; Hypertension in her maternal aunt and maternal grandfather; Migraines in her maternal grandmother, maternal uncle, and mother; Seizures in an other family member.   Social history: Social History   Socioeconomic History  . Marital status: Single    Spouse name: Not on file  . Number of children: Not on file  . Years of education: Not on file  . Highest education level: Not on file  Occupational History  . Not on file  Tobacco Use  . Smoking status: Never Smoker  . Smokeless tobacco:  Never Used  Substance and Sexual Activity  . Alcohol use: No  . Drug use: No  . Sexual activity: Never  Other Topics Concern  . 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   She attended [employer].   She lives with both parents. She has two older sisters.   She enjoys chilling, shopping, and hanging with friends.   Social Determinants of Health   Financial Resource Strain: Not on file  Food Insecurity: Not on file  Transportation Needs: Not on file  Physical Activity: Not on file  Stress: Not on file  Social Connections: Not on file  Intimate Partner Violence: Not on file    Past/failed meds: Copied from previous record: Keppra - rhabdomyolysis  Allergies: Allergies  Allergen Reactions  . Keppra [Levetiracetam] Other (See Comments)    Rhabdomyolysis  . Oseltamivir Phosphate Other (See Comments)    Family relates connection between tamiflu and seizures    Immunizations: Immunization History  Administered Date(s) Administered  . Influenza,inj,Quad PF,6+ Mos 12/31/2017    Diagnostics/Screenings: Copied from previous record: 10/30/17 - Sleep deprived EEG -This is anormalrecord with the patient awake, drowsy and asleep.A normal EEG does not rule out the presence of seizures. Ellison Carwin, MD  12/19/2019 - MRI Brain wwo contrast ([facility], [place]) - normal  12/19/2019 - Continuous EEG ([facility]) - Improving, initially severe and ultimately mild to moderate diffuse encephalopathy, but non-specific as to  etiology. No epileptiform discharges, no seizures, no focal or lateralizing signs are seen  Physical Exam: BP 112/78   Pulse 88   Ht 5\' 3"  (1.6 m)   Wt 227 lb 12.8 oz (103.3 kg)   BMI 40.35 kg/m   General: Well developed, well nourished young woman, seated on exam table, in no evident distress, black hair, brown eyes, right handed Head: Head normocephalic and atraumatic.  Oropharynx  benign. Neck: Supple Cardiovascular: Regular rate and rhythm, no murmurs Respiratory: Breath sounds clear to auscultation Musculoskeletal: No obvious deformities or scoliosis Skin: No rashes or neurocutaneous lesions  Neurologic Exam Mental Status: Awake and fully alert.  Oriented to place and time.  Recent and remote memory intact.  Attention span, concentration, and fund of knowledge appropriate.  Mood and affect appropriate. Cranial Nerves: Fundoscopic exam reveals sharp disc margins.  Pupils equal, briskly reactive to light.  Extraocular movements full without nystagmus.  Visual fields full to confrontation.  Hearing intact and symmetric to finger rub.  Facial sensation intact.  Face tongue, palate move normally and symmetrically.  Neck flexion and extension normal. Motor: Normal bulk and tone. Normal strength in all tested extremity muscles. Sensory: Intact to touch and temperature in all extremities.  Coordination: Rapid alternating movements normal in all extremities.  Finger-to-nose and heel-to shin performed accurately bilaterally.  Romberg negative. Gait and Station: Arises from chair without difficulty.  Stance is normal. Gait demonstrates normal stride length and balance.   Able to heel, toe and tandem walk without difficulty. Reflexes: 1+ and symmetric. Toes downgoing.  Impression: 1. Nocturnal seizures 2. Migraine without aura 3. Episodic tension headache 4. Iron deficiency anemia 5. Seasonal allergies 6. Problems with medication and treatment plan non-compliance 7. Problems with angry mood and easy frustration  Recommendations for plan of care: The patient's previous [facility] records were reviewed. Ambrosia has neither had nor required imaging or lab studies since the last visit. She is a 25 year old woman with history of nocturnal seizures. She is taking and tolerating Briviact and Lamotrigine, and has remained seizure free since the Briviact was added in October 2021. There have  been problems with compliance with the treatment plan but that has improved since her mother has been administering the medication. She also has history of migraine and tension headaches, iron deficiency anemia, seasonal allergies, as well as angry mood and easy frustration. I talked with November 2021 today and told her that she can return to driving. I stressed that she must be compliant with medication, that she must get at least 8 hours of sleep each night and that if seizures occur, she will be restricted from driving again. Teva is looking for a new job and I advised her to try to get a day time job so that she can sleep at night, or if she must work evening shift that she does not rotate shifts.   I also talked with her about her mother's concern about her problems with angry mood, and recommended referral to [facility]. She agreed with the plans made today. I will see Chrys back in follow up in 3 months or sooner if needed. I will refer her to Dr Lemont Fillers for transfer of care to adult neurology once she has been evaluated and treated by [facility].   The medication list was reviewed and reconciled. No changes were made in the prescribed medications today. A complete medication list was provided to the patient.  Orders Placed This Encounter  Procedures  .  Ambulatory referral to Integrated Behavioral [facility]    Referral Priority:   Routine    Referral Type:   Consultation    Referral Reason:   Specialty Services Required    Referred to Provider:   [place] Callas, PhD    Number of Visits Requested:   1     Allergies as of 03/07/2020      Reactions   Keppra [levetiracetam] Other (See Comments)   Rhabdomyolysis   Oseltamivir Phosphate Other (See Comments)   Family relates connection between tamiflu and seizures      Medication List       Accurate as of March 07, 2020 11:59 PM. If you have any questions, ask your nurse or doctor.         Briviact 50 MG Tabs Generic drug: Brivaracetam Take 1 tablet twice per day   lamoTRIgine 100 MG tablet Commonly known as: LAMICTAL Take 1 tablet twice per day What changed: additional instructions Changed by: Elveria Rising, NP   loratadine 5 MG/5ML syrup Commonly known as: CLARITIN loratadine 5 mg/5 mL oral solution  TAKE 10 MLS AT BEDTIME   polyethylene glycol powder 17 GM/SCOOP powder Commonly known as: GLYCOLAX/MIRALAX polyethylene glycol 3350 17 gram/dose oral powder  1 (ONE) CAPFUL, ORAL, DAILY   SODIUM FLUORIDE (DENTAL RINSE) 0.2 % Soln sodium fluoride 0.2 % dental solution  RINSE AND SPIT EVERY DAY AT BEDTIME   SUMAtriptan 25 MG tablet Commonly known as: IMITREX Take 1 tablet at onset of migraine along with Ibuprofen 400mg . May repeat once in 2 hours if headache persists or recurs.       Total time spent with the patient was 20 minutes, of which 50% or more was spent in counseling and coordination of care.  NP-C [facility] Ph. [phone] Fax [phone]

## 2020-03-07 NOTE — Patient Instructions (Signed)
Thank you for coming in today. Congratulations on remaining seizure free!  Instructions for you until your next appointment are as follows: 1. I will refer you to Dr Huntley Dec in this office for your problems with anger and easy frustration 2. Continue taking your medications as prescribed. Try not to miss any doses 3. Be sure to get at least 8 hours of sleep each night.  4. As you look for a new job, try to avoid rotating shifts, so that you can get enough sleep each night. 5. Let me know if you have any seizures.  6. Please plan to return for follow up in 3 months or sooner if needed.

## 2020-03-11 ENCOUNTER — Encounter (INDEPENDENT_AMBULATORY_CARE_PROVIDER_SITE_OTHER): Payer: Self-pay | Admitting: Family

## 2020-03-14 ENCOUNTER — Ambulatory Visit (INDEPENDENT_AMBULATORY_CARE_PROVIDER_SITE_OTHER): Payer: 59 | Admitting: Family

## 2020-04-06 ENCOUNTER — Encounter (INDEPENDENT_AMBULATORY_CARE_PROVIDER_SITE_OTHER): Payer: Self-pay

## 2020-06-05 ENCOUNTER — Encounter (INDEPENDENT_AMBULATORY_CARE_PROVIDER_SITE_OTHER): Payer: Self-pay | Admitting: Family

## 2020-06-05 ENCOUNTER — Other Ambulatory Visit: Payer: Self-pay

## 2020-06-05 ENCOUNTER — Ambulatory Visit (INDEPENDENT_AMBULATORY_CARE_PROVIDER_SITE_OTHER): Payer: 59 | Admitting: Family

## 2020-06-05 VITALS — BP 104/72 | HR 88 | Ht 62.75 in | Wt 210.6 lb

## 2020-06-05 DIAGNOSIS — N3944 Nocturnal enuresis: Secondary | ICD-10-CM | POA: Diagnosis not present

## 2020-06-05 DIAGNOSIS — G40309 Generalized idiopathic epilepsy and epileptic syndromes, not intractable, without status epilepticus: Secondary | ICD-10-CM | POA: Diagnosis not present

## 2020-06-05 DIAGNOSIS — G40209 Localization-related (focal) (partial) symptomatic epilepsy and epileptic syndromes with complex partial seizures, not intractable, without status epilepticus: Secondary | ICD-10-CM | POA: Diagnosis not present

## 2020-06-05 MED ORDER — LAMOTRIGINE 100 MG PO TABS: ORAL_TABLET | ORAL | 5 refills | Status: DC

## 2020-06-05 MED ORDER — BRIVIACT 50 MG PO TABS: ORAL_TABLET | ORAL | 5 refills | Status: DC

## 2020-06-05 NOTE — Patient Instructions (Signed)
Thank you for coming in today.   Instructions for you until your next appointment are as follows: 1. Continue taking your medication as prescribed. Try not to miss any doses 2. Be sure that you are getting at least 8 hrs of sleep each night. Limit naps during the day so that you will be able to get to sleep on time at night. 3. I will refer you to Dr Patrcia Dolly with [facility]. This is to transfer your care to an adult neurology provider. Dr Aquino's phone number is [phone]. Her office will call you to schedule the next appointment.  4. If you have seizures or concerns before you see Dr Karel Jarvis, please let me know. You should see Dr Karel Jarvis in the next 2-3 months. I sent refills to the pharmacy to cover you until you are seen in that office.  5. Talk with your gynecologist about the times you have urinated in bed during sleep.   At [facility], we are committed to providing exceptional care. You will receive a patient satisfaction survey through text or email regarding your visit today. Your opinion is important to me. Comments are appreciated.

## 2020-06-05 NOTE — Progress Notes (Signed)
Olivia Castillo   MRN:  [id number]  02-Oct-1995   Provider: Elveria Rising NP-C Location of Care: [facility] Child Neurology  Visit type: Routine Follow-Up  Last visit: 03/07/2020  Referral source: Aaron Edelman, MD History from: patient, chcn chart, mom  Brief history:  Copied from previous record: History of headaches and seizures. She is taking and tolerating Briviact and Lamotrigine for nocturnal seizures. She also has history of iron deficiency anemia.She was hospitalized in [place], [place] in October 2021 for status epilepticus in the setting of probable missed doses and sleep deprivation. She had rhabdomyolysis related to IV Keppra administration and acute renal injury during that hospitalization.   Today's concerns: Olivia Castillo reports today that she has remained seizure free since her last visit. She says that she has been compliant with medication and typically gets at least 8 hours of sleep each night. 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   Olivia Castillo reports today that she has had instances of urination during sleep without having seizure activity. She says that she has done so since childhood.   Olivia Castillo has been otherwise generally healthy since she was last seen. She has no other health concerns today other than previously mentioned.  Review of systems: Please see HPI for neurologic and other pertinent review of systems. Otherwise all other systems were reviewed and were negative.  Problem List: Patient Active Problem List   Diagnosis Date Noted  . Encounter for long-term (current) use of high-risk medication 12/09/2019  . 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 Iron deficiency anemia 02/01/2018  . Intermittent explosive disorder in adult 10/28/2016  . Performance anxiety 10/28/2016  . 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 Partial epilepsy with impairment of consciousness (HCC) 10/13/2012  .  Episodic tension type headache 10/13/2012  . Migraine without aura 10/13/2012  . Migraine with aura, without mention of intractable migraine without mention of status migrainosus 10/13/2012  . Dysfunctions associated with sleep stages or arousal from sleep 10/13/2012  . Headache(784.0) 10/13/2012     Past Medical History:  Diagnosis Date  . Headache   . Seizure [facility])     Past medical history comments: See HPI Copied from previous record: patient was evaluated October 09, 2005 for headaches and insomnia. I concluded that she had migraine without aura and tension-type headaches. 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 She went to bed quite late and slept soundly for long periods of time. I recommended melatonin and possiblyClonidine as well as sleep hygiene.  I saw her again January 29, 2009 with history of headaches twice a week. She came home early on 2 occasions. I noted positive family history of migraines, a normal neurological examination, and a diagnosis of migraine without aura, migraine with aura and episodic tension type headaches. Mother mentioned that the patient had headaches and lost vision at times when her hair was being combed, often without changing position or elevating her head. These episodes were not associated with syncope because she did not lose consciousness nor fall.  She had onset of nocturnal seizures in February 10, 2011. This may have been preceded by an episode of twisting of her mouth associated clot laughter during an episode of flu in late November, 2012. CT scan of the brain February 05, 2011 was normal without contrast. EEG February 11, 2011 was normal with the patient awake and drowsy.  Routine EEG in December 2013 was normal.Patient was  hospitalized due to seizure activity in 2013.  A 48-hour ambulatory EEG showed 12 electrographic seizures lasting 45 to 102 seconds with a central 6 hertz theta range activity that  localized to either side with secondary generalization and a generalized discharge to 20 hertz beta range activity shifting to 6 hertz generalized theta range activity followed by delta. Shifting frequencies convinced me that this was seizure activity as opposed to a rhythmic mid-temporal discharge.  She tapered off Lamotrigine in 2014.  She had problems with bedwetting between ages 54 and 67.  Birth History 7 lbs. 3 oz. infant born at term to a 54 year old gravida 3 para 2000 woman. Gestation was complicated by spotting mother recalls having "some heart trouble". She can't recall the details. She had excessive nausea during the first trimester and took an antiemetic. Labor lasted for 23 hours mother received epidural anesthesia. Normal spontaneous vaginal delivery. Delivery room was uncomplicated except for suctioning within her mouth. She did not require resuscitation.  Nursery course was uneventful. She went home with her mother.  Growth and development is recorded on the chart is normal.  Behavior History intermittent explosive anger, performance anxiety;as a child she would become upset easily which continued into her teen years  Surgical history: No past surgical history on file.   Family history: family history includes ADD / ADHD in her cousin; Heart disease in her maternal grandfather; Hypertension in her maternal aunt and maternal grandfather; Migraines in her maternal grandmother, maternal uncle, and mother; Seizures in an other family member.   Social history: Social History   Socioeconomic History  . Marital status: Single    Spouse name: Not on file  . Number of children: Not on file  . Years of education: Not on file  . Highest education level: Not on file  Occupational History  . Not on file  Tobacco Use  . Smoking status: Never Smoker  . Smokeless tobacco: Never Used  Substance and Sexual Activity  . Alcohol use: No  . Drug use: No  . Sexual activity:  Never  Other Topics Concern  . 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   She attended [employer].   She lives with both parents. She has two older sisters.   She enjoys chilling, shopping, and hanging with friends.   Social Determinants of Health   Financial Resource Strain: Not on file  Food Insecurity: Not on file  Transportation Needs: Not on file  Physical Activity: Not on file  Stress: Not on file  Social Connections: Not on file  Intimate Partner Violence: Not on file    Past/failed meds: Copied from previous record: Keppra - rhabdomyolysis  Allergies: Allergies  Allergen Reactions  . Keppra [Levetiracetam] Other (See Comments)    Rhabdomyolysis  . Oseltamivir Phosphate Other (See Comments)    Family relates connection between tamiflu and seizures    Immunizations: Immunization History  Administered Date(s) Administered  . Influenza,inj,Quad PF,6+ Mos 12/31/2017    Diagnostics/Screenings: Copied from previous record: 10/30/17 - Sleep deprived EEG -This is anormalrecord with the patient awake, drowsy and asleep.A normal EEG does not rule out the presence of seizures. Ellison Carwin, MD  12/19/2019 - MRI Brain wwo contrast ([facility], [place]) - normal  12/19/2019 - Continuous EEG ([facility] [place]) - Improving, initially severe and ultimately mild to moderate diffuse encephalopathy, but non-specific as to etiology. No epileptiform discharges, no seizures, no focal or lateralizing signs are seen  Physical Exam: BP 104/72   Pulse 88   Ht 5' 2.75" (1.594 m)   Wt 210 lb 9.6 oz (95.5 kg)   LMP 06/03/2020   BMI 37.60 kg/m   General: Well developed, well nourished young woman, seated on exam table, in no evident distress, black hair, brown eyes, right handed Head: Head normocephalic and atraumatic.  Oropharynx benign. Neck: Supple Cardiovascular: Regular rate and rhythm, no murmurs Respiratory:  Breath sounds clear to auscultation Musculoskeletal: No obvious deformities or scoliosis Skin: No rashes or neurocutaneous lesions  Neurologic Exam Mental Status: Awake and fully alert.  Oriented to place and time.  Recent and remote memory intact.  Attention span, concentration, and fund of knowledge appropriate.  Mood and affect appropriate. Cranial Nerves: Fundoscopic exam reveals sharp disc margins.  Pupils equal, briskly reactive to light.  Extraocular movements full without nystagmus.  Visual fields full to confrontation.  Hearing intact and symmetric to finger rub.  Facial sensation intact.  Face tongue, palate move normally and symmetrically.  Neck flexion and extension normal. Motor: Normal bulk and tone. Normal strength in all tested extremity muscles. Sensory: Intact to touch and temperature in all extremities.  Coordination: Rapid alternating movements normal in all extremities.  Finger-to-nose and heel-to shin performed accurately bilaterally.  Romberg negative. Gait and Station: Arises from chair without difficulty.  Stance is normal. Gait demonstrates normal stride length and balance.   Able to heel, toe and tandem walk without difficulty. Reflexes: 1+ and symmetric. Toes downgoing.  Impression: Generalized tonic clonic epilepsy (HCC) - Plan: Ambulatory referral to Neurology, BRIVIACT 50 MG TABS, lamoTRIgine (LAMICTAL) 100 MG tablet  Partial epilepsy with impairment of consciousness (HCC) - Plan: Ambulatory referral to Neurology, BRIVIACT 50 MG TABS, lamoTRIgine (LAMICTAL) 100 MG tablet  Enuresis, nocturnal only   Recommendations for plan of care: The patient's previous [facility] records were reviewed. Olivia Castillo has neither had nor required imaging or lab studies since the last visit. She is a 25 year old young woman with history of generalized tonic-clonic and partial epilepsy. She has typically had breakthrough seizures in the setting of missed medication and sleep deprivation. She is  taking and tolerating Briviact and Lamotrigine for her seizure disorder and has remained seizure free since her last visit. I reminded Olivia Castillo that it is important to avoid missing medication doses and that she needs to be sure to get at least 8 hours of sleep each night. I also talked with her about transfer of care to an adult neurology provider now that she is an adult and working full time. I will refer her to Dr Patrcia Dolly for ongoing neurology care.   Olivia Castillo reports episodes of incontinence of urine during sleep that has occurred without the presence of seizure activity. I encouraged her to talk with her gynecologist about this problem.   The medication list was reviewed and reconciled. No changes were made in the prescribed medications today. A complete medication list was provided to the patient.  Orders Placed This Encounter  Procedures  . Ambulatory referral to Neurology    Referral Priority:   Routine    Referral Type:   Consultation    Referral Reason:   Specialty Services Required    Referred to Provider:   Van Clines, MD    Requested Specialty:   Neurology    Number of Visits Requested:   1     Allergies as of 06/05/2020      Reactions   Keppra [levetiracetam] Other (See Comments)  Rhabdomyolysis   Oseltamivir Phosphate Other (See Comments)   Family relates connection between tamiflu and seizures      Medication List       Accurate as of June 05, 2020 11:59 PM. If you have any questions, ask your nurse or doctor.        STOP taking these medications   loratadine 5 MG/5ML syrup Commonly known as: CLARITIN Stopped by: Elveria Risingina Manisha Cancel, NP   polyethylene glycol powder 17 GM/SCOOP powder Commonly known as: GLYCOLAX/MIRALAX Stopped by: Elveria Risingina Joseline Mccampbell, NP   SODIUM FLUORIDE (DENTAL RINSE) 0.2 % Soln Stopped by: Elveria Risingina Tersea Aulds, NP     TAKE these medications   Briviact 50 MG Tabs Generic drug: Brivaracetam Take 1 tablet twice per day   lamoTRIgine  100 MG tablet Commonly known as: LAMICTAL Take 1 tablet twice per day   SUMAtriptan 25 MG tablet Commonly known as: IMITREX Take 1 tablet at onset of migraine along with Ibuprofen 400mg . May repeat once in 2 hours if headache persists or recurs.       Total time spent with the patient was 20 minutes, of which 50% or more was spent in counseling and coordination of care.  Elveria Risingina Nelta Caudill NP-C [facility] Ph. [phone] Fax [phone]

## 2020-06-07 ENCOUNTER — Encounter: Payer: Self-pay | Admitting: Neurology

## 2020-06-16 ENCOUNTER — Other Ambulatory Visit: Payer: Self-pay

## 2020-06-16 ENCOUNTER — Encounter (HOSPITAL_COMMUNITY): Payer: Self-pay

## 2020-06-16 ENCOUNTER — Ambulatory Visit (INDEPENDENT_AMBULATORY_CARE_PROVIDER_SITE_OTHER): Payer: 59

## 2020-06-16 ENCOUNTER — Ambulatory Visit (HOSPITAL_COMMUNITY)
Admission: EM | Admit: 2020-06-16 | Discharge: 2020-06-16 | Disposition: A | Discharge status: Home / Self Care | Payer: 59 | Attending: Student | Admitting: Student

## 2020-06-16 DIAGNOSIS — W101XXA Fall (on)(from) sidewalk curb, initial encounter: Secondary | ICD-10-CM

## 2020-06-16 DIAGNOSIS — M25572 Pain in left ankle and joints of left foot: Secondary | ICD-10-CM

## 2020-06-16 DIAGNOSIS — M25472 Effusion, left ankle: Secondary | ICD-10-CM | POA: Diagnosis not present

## 2020-06-16 DIAGNOSIS — S82832A Other fracture of upper and lower end of left fibula, initial encounter for closed fracture: Secondary | ICD-10-CM | POA: Diagnosis not present

## 2020-06-16 DIAGNOSIS — S99912A Unspecified injury of left ankle, initial encounter: Secondary | ICD-10-CM | POA: Diagnosis not present

## 2020-06-16 MED ORDER — TRAMADOL HCL 50 MG PO TABS: 50.0000 mg | ORAL_TABLET | Freq: Four times a day (QID) | ORAL | 0 refills | Status: DC | PRN

## 2020-06-16 NOTE — ED Provider Notes (Signed)
[facility]    CSN: [id number] Arrival date & time: 06/16/20  1653      History   Chief Complaint Chief Complaint  Patient presents with  . Ankle Pain    HPI Olivia Castillo is a 25 y.o. female presenting with left ankle injury.  Medical history seizures, but she denies dizziness or LOC before or after her injury.  States she stepped off a curb last night and lost her balance, inverting the foot.  She heard a pop and immediately felt pain.  Today presenting with swelling and pain over the lateral aspect of her left ankle.  She is unable to ambulate due to the pain.  Some decreased sensation in her toes.  Denies injury elsewhere.Marland Kitchen  HPI  Past Medical History:  Diagnosis Date  . Headache   . Seizure [facility])     Patient Active Problem List   Diagnosis Date Noted  . Encounter for long-term (current) use of high-risk medication 12/09/2019  . 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 Iron deficiency anemia 02/01/2018  . Intermittent explosive disorder in adult 10/28/2016  . Performance anxiety 10/28/2016  . 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 Partial epilepsy with impairment of consciousness (HCC) 10/13/2012  . Episodic tension type headache 10/13/2012  . Migraine without aura 10/13/2012  . Migraine with aura, without mention of intractable migraine without mention of status migrainosus 10/13/2012  . Dysfunctions associated with sleep stages or arousal from sleep 10/13/2012  . Headache(784.0) 10/13/2012    History reviewed. No pertinent surgical history.  OB History   No obstetric history on file.      Home Medications    Prior to Admission medications   Medication Sig Start Date End Date Taking? Authorizing Provider  traMADol (ULTRAM) 50 MG tablet Take 1 tablet (50 mg total) by mouth every 6 (six) hours as needed. 06/16/20  Yes Rhys Martini, PA-C  BRIVIACT 50 MG TABS Take 1 tablet twice per day 06/05/20   Elveria Rising, NP   lamoTRIgine (LAMICTAL) 100 MG tablet Take 1 tablet twice per day 06/05/20   Elveria Rising, NP  loratadine (CLARITIN) 5 MG/5ML syrup loratadine 5 mg/5 mL oral solution  TAKE 10 MLS AT BEDTIME Patient not taking: Reported on 06/05/2020    [provider]  polyethylene glycol powder (GLYCOLAX/MIRALAX) 17 GM/SCOOP powder polyethylene glycol 3350 17 gram/dose oral powder  1 (ONE) CAPFUL, ORAL, DAILY Patient not taking: Reported on 06/05/2020    [provider]  SODIUM FLUORIDE, DENTAL RINSE, 0.2 % SOLN sodium fluoride 0.2 % dental solution  RINSE AND SPIT EVERY DAY AT BEDTIME Patient not taking: Reported on 06/05/2020    [provider]  SUMAtriptan (IMITREX) 25 MG tablet Take 1 tablet at onset of migraine along with Ibuprofen 400mg . May repeat once in 2 hours if headache persists or recurs. 05/30/19   07/30/19, NP    Family History Family History  Problem Relation Age of Onset  . Migraines Mother   . Migraines Maternal Grandmother   . Heart disease Maternal Grandfather   . Hypertension Maternal Grandfather   . Hypertension Maternal Aunt   . Migraines Maternal Uncle   . Seizures Other        MGA  . ADD / ADHD Cousin        Maternal 1 st cousin    Social History Social History   Tobacco Use  . Smoking status: Never Smoker  . Smokeless tobacco: Never Used  Substance Use  Topics  . Alcohol use: No  . Drug use: No     Allergies   Keppra [levetiracetam] and Oseltamivir phosphate   Review of Systems Review of Systems  Musculoskeletal:       L ankle injury  All other systems reviewed and are negative.    Physical Exam Triage Vital Signs ED Triage Vitals  Enc Vitals Group     BP 06/16/20 1747 127/90     Pulse Rate 06/16/20 1747 (!) 104     Resp 06/16/20 1747 18     Temp 06/16/20 1747 98.6 F (37 C)     Temp src --      SpO2 06/16/20 1747 99 %     Weight --      Height --      Head Circumference --      Peak Flow --      Pain  Score 06/16/20 1745 10     Pain Loc --      Pain Edu? --      Excl. in GC? --    No data found.  Updated Vital Signs BP 127/90   Pulse (!) 104   Temp 98.6 F (37 C)   Resp 18   LMP 06/03/2020   SpO2 99%   Visual Acuity Right Eye Distance:   Left Eye Distance:   Bilateral Distance:    Right Eye Near:   Left Eye Near:    Bilateral Near:     Physical Exam Vitals reviewed.  Constitutional:      General: She is not in acute distress.    Appearance: Normal appearance. She is not ill-appearing or diaphoretic.  HENT:     Head: Normocephalic and atraumatic.  Cardiovascular:     Rate and Rhythm: Normal rate and regular rhythm.     Heart sounds: Normal heart sounds.  Pulmonary:     Effort: Pulmonary effort is normal.     Breath sounds: Normal breath sounds.  Musculoskeletal:     Comments: Left ankle with effusion to lateral aspect surrounding lateral malleolus.  Diffusely tender to palpation over lateral malleolus and along anterior talofibular ligament.  Range of motion limited with inversion and eversion, but she can perform limited plantar and dorsiflexion.  Range of motion toes intact and without pain.  No midfoot pain.  DP 2+, cap refill is < 2 seconds.  She is ambulating with wheelchair due to pain.  Skin:    General: Skin is warm.  Neurological:     General: No focal deficit present.     Mental Status: She is alert and oriented to person, place, and time.  Psychiatric:        Mood and Affect: Mood normal.        Behavior: Behavior normal.        Thought Content: Thought content normal.        Judgment: Judgment normal.      UC Treatments / Results  Labs (all labs ordered are listed, but only abnormal results are displayed) Labs Reviewed - No data to display  EKG   Radiology DG Ankle Complete Left  Result Date: 06/16/2020 CLINICAL DATA:  Left ankle pain and swelling after injury, stepped off sidewalk last night. EXAM: LEFT ANKLE COMPLETE - 3+ VIEW  COMPARISON:  None. FINDINGS: Oblique mildly displaced distal fibular fracture at the level of the ankle mortise. No mortise widening of widening of the medial clear space. No fracture of the distal tibia or hindfoot. Generalized soft  tissue edema appears more prominent laterally. IMPRESSION: Oblique mildly displaced distal fibular fracture at the level of the ankle mortise. Electronically Signed   By: Narda Rutherford M.D.   On: 06/16/2020 18:00    Procedures Procedures (including critical care time)  Medications Ordered in UC Medications - No data to display  Initial Impression / Assessment and Plan / UC Course  I have reviewed the triage vital signs and the nursing notes.  Pertinent labs & imaging results that were available during my care of the patient were reviewed by me and considered in my medical decision making (see chart for details).     This patient is a 25 year old female presenting with mildly displaced distal fibular fracture following inversion injury. Neurovascularly intact.   Xray L ankle- Oblique mildly displaced distal fibular fracture at the level of the ankle mortise Posterior short leg splint applied.  Tramadol for pain.  States she could not be pregnant. RICE, tylenol/ibuprofen. Follow-up with Ortho at their earliest convenience.  Information provided. ED return precautions discussed.  Final Clinical Impressions(s) / UC Diagnoses   Final diagnoses:  Closed fracture of distal end of left fibula, unspecified fracture morphology, initial encounter     Discharge Instructions     -Keep your splint on until you follow-up with the sports medicine doctor. Use your crutches whenever you need to walk around.  -Tramadol for pain, up to every 6 hours.  This can cause drowsiness, -You can also try elevating the leg to reduce swelling.  It is okay to take Tylenol and ibuprofen in addition to the tramadol. -Call orthopedist/sports medicine doctor on Monday to schedule  follow-up with them.  Information below.  Ask for an appointment at their earliest convenience.    ED Prescriptions    Medication Sig Dispense Auth. Provider   traMADol (ULTRAM) 50 MG tablet Take 1 tablet (50 mg total) by mouth every 6 (six) hours as needed. 15 tablet Rhys Martini, PA-C     I have reviewed the PDMP during this encounter.   Rhys Martini, PA-C 06/16/20 [id number]

## 2020-06-16 NOTE — Discharge Instructions (Addendum)
-  Keep your splint on until you follow-up with the sports medicine doctor. Use your crutches whenever you need to walk around.  -Tramadol for pain, up to every 6 hours.  This can cause drowsiness, -You can also try elevating the leg to reduce swelling.  It is okay to take Tylenol and ibuprofen in addition to the tramadol. -Call orthopedist/sports medicine doctor on Monday to schedule follow-up with them.  Information below.  Ask for an appointment at their earliest convenience.

## 2020-06-16 NOTE — Progress Notes (Signed)
Orthopedic Tech Progress Note Patient Details:  Olivia Castillo Aug 24, 1995 [id number]  Ortho Devices Type of Ortho Device: Post (short leg) splint,Crutches Ortho Device/Splint Location: Lower Left Extremity Ortho Device/Splint Interventions: Ordered,Application   Post Interventions Patient Tolerated: Well Instructions Provided: Care of device,Poper ambulation with device   Gerald Stabs 06/16/2020, 6:31 PM

## 2020-06-16 NOTE — ED Triage Notes (Signed)
Pt presents with c/o left ankle injury after stepping off sidewalk last night, swelling noted

## 2020-06-17 ENCOUNTER — Encounter (INDEPENDENT_AMBULATORY_CARE_PROVIDER_SITE_OTHER): Payer: Self-pay | Admitting: Family

## 2020-06-17 DIAGNOSIS — N3944 Nocturnal enuresis: Secondary | ICD-10-CM | POA: Insufficient documentation

## 2020-06-19 ENCOUNTER — Ambulatory Visit (INDEPENDENT_AMBULATORY_CARE_PROVIDER_SITE_OTHER): Payer: 59 | Admitting: Sports Medicine

## 2020-06-19 ENCOUNTER — Other Ambulatory Visit: Payer: Self-pay

## 2020-06-19 VITALS — BP 110/78 | Ht 63.0 in | Wt 212.0 lb

## 2020-06-19 DIAGNOSIS — S82832D Other fracture of upper and lower end of left fibula, subsequent encounter for closed fracture with routine healing: Secondary | ICD-10-CM

## 2020-06-19 NOTE — Progress Notes (Signed)
   PCP: Marcelle Overlie, MD  Subjective:   HPI: Patient is a 25 y.o. female here for fibula fracture follow-up.  Distal fibula fracture Ms. [place]ville was seen in urgent care on 4/23 for a left distal fibula fracture.  At that time, she was put in a posterior splint and told to follow-up with sports medicine.  Since her visit to urgent care, 3 days ago, she has been using crutches and been keeping her splint on.  She has had minimal discomfort and only needed to use 3 Toradol.  She concerns about pain management at this time.  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  She would like to return to work as soon as possible.  Review of Systems:  Per HPI.   PMFSH, medications and smoking status reviewed.      Objective:  Physical Exam:  No flowsheet data found.   Gen: awake, alert, NAD, comfortable in exam room Pulm: breathing unlabored  Ankle: - Inspection: Swelling noted around the ankle diffusely and over the dorsal foot.  Mild ecchymosis present over the lateral malleolus.  No obvious bony abnormality or deformity. - Palpation: Tenderness to palpation over the lateral malleolus.  No tenderness over the fifth metatarsal or navicular.  No tenderness over the medial malleolus.  No tenderness with palpation of the gastrocs or soleus. - Strength: Strength exam limited by pain for supination and dorsiflexion. - ROM: Full ROM - Neuro/vasc: NV intact - Special Tests: Negative heel squeeze.  Negative squeeze test over the metatarsals.     Assessment & Plan:  1.  Left distal fibula fracture Nondisplaced, distal fibula fracture.  Medial malleolus is nontender to palpation, indicating low concern for injury of the deltoid ligament.  The fracture line is roughly in line with the mortise. Hopefully, this will not require surgical intervention but I would like her to be assessed by orthopedics.  For now, we will put her in a cam walker boot and  have her nonweightbearing until she is seen by orthopedics. -No additional pain control needed for now -Follow-up with orthopedics on 4/27 -CAM Walker boot -Nonweightbearing -Work note provided excusing her from work until she can be seen by orthopedics   Patient seen and evaluated with the resident.  I agree with the above plan of care.  Review of the x-rays show a minimally displaced obliquely oriented distal fibular fracture at the level of the mortise.  Although this fracture appears to be stable, I would prefer that she be followed by an orthopedic surgeon.  She has an appointment with Dr. Dion Saucier tomorrow morning.  In the meantime, I have placed her into a cam walker and she will continue her nonweightbearing status until consultation with him.  I will defer further work-up and treatment to the discretion of Dr. Dion Saucier.  She will follow-up with [place] as needed.

## 2020-06-19 NOTE — Patient Instructions (Signed)
We are going to refer you to Dr. Dion Saucier to further evaluate your left ankle. [facility] [address] [place] [phone]  Appt: Wednesday 06/20/20 @ 9:45 am, please arrive at 9:30 am.

## 2020-06-28 ENCOUNTER — Encounter (INDEPENDENT_AMBULATORY_CARE_PROVIDER_SITE_OTHER): Payer: Self-pay

## 2020-09-04 ENCOUNTER — Other Ambulatory Visit: Payer: 59

## 2020-09-04 ENCOUNTER — Encounter: Payer: Self-pay | Admitting: Neurology

## 2020-09-04 ENCOUNTER — Ambulatory Visit (INDEPENDENT_AMBULATORY_CARE_PROVIDER_SITE_OTHER): Payer: 59 | Admitting: Neurology

## 2020-09-04 ENCOUNTER — Other Ambulatory Visit: Payer: Self-pay

## 2020-09-04 ENCOUNTER — Telehealth: Payer: Self-pay | Admitting: Neurology

## 2020-09-04 VITALS — BP 102/68 | HR 68 | Ht 63.0 in | Wt 200.2 lb

## 2020-09-04 DIAGNOSIS — G40209 Localization-related (focal) (partial) symptomatic epilepsy and epileptic syndromes with complex partial seizures, not intractable, without status epilepticus: Secondary | ICD-10-CM | POA: Diagnosis not present

## 2020-09-04 DIAGNOSIS — R569 Unspecified convulsions: Secondary | ICD-10-CM | POA: Diagnosis not present

## 2020-09-04 DIAGNOSIS — G40309 Generalized idiopathic epilepsy and epileptic syndromes, not intractable, without status epilepticus: Secondary | ICD-10-CM

## 2020-09-04 DIAGNOSIS — G43009 Migraine without aura, not intractable, without status migrainosus: Secondary | ICD-10-CM

## 2020-09-04 MED ORDER — FOLIC ACID 1 MG PO TABS: 1.0000 mg | ORAL_TABLET | Freq: Every day | ORAL | 11 refills | Status: DC

## 2020-09-04 MED ORDER — BRIVIACT 50 MG PO TABS: ORAL_TABLET | ORAL | 5 refills | Status: DC

## 2020-09-04 MED ORDER — LAMOTRIGINE 100 MG PO TABS
ORAL_TABLET | ORAL | 11 refills | Status: DC
Start: 2020-09-04 — End: 2021-12-31

## 2020-09-04 MED ORDER — SUMATRIPTAN SUCCINATE 25 MG PO TABS: ORAL_TABLET | ORAL | 6 refills | Status: DC

## 2020-09-04 NOTE — Telephone Encounter (Signed)
Penny from the lab called in to let Olivia Castillo know the patient did not show up for labs.

## 2020-09-04 NOTE — Progress Notes (Signed)
NEUROLOGY CONSULTATION NOTE  Olivia JEDLICKA MRN: [id number] DOB: [birth year]  Referring provider: Elveria Rising, NP Primary care provider: Dr. Marcelle Overlie  Reason for consult:  establish adult epilepsy care   Thank you for your kind referral of Olivia Castillo for consultation of the above symptoms. Although her history is well known to you, please allow me to reiterate it for the purpose of our medical record. She is alone in the office today. Records and images were personally reviewed where available.   HISTORY OF PRESENT ILLNESS: This is a 25 year old right-handed woman with a history of migraines and nocturnal seizures presenting to establish care. She was previously followed by pediatric neurology, last visit in 05/2020. Records were reviewed and will be summarized as follows. She had initially seen Dr. Sharene Skeans in 2007 for migraines and tension-type headaches. She started having nocturnal seizures in 2012. CT head was normal. EEG in 2012 and 2013 were normal. She had a 48-hour EEG which captured 12 electrographic seizures lasting 45 to 102 seconds with central 6 Hz theta range activity localized to either side with secondary generalization and a generalized discharge to 20 Hz beta range activity shifting to 6 Hz generalized theta activity followed by delta slowing. Shifting frequencies indicated it was seizure as opposed to rhythmic mid-temporal discharge. She was started on Lamotrigine. She was admitted to [facility] in [place] for status epilepticus in 11/2019. At that time, she missed medication, drank alcohol, and within an hour of so of sleep, began having recurrent seizures requiring intubation. Brain MRI with and without contrast at [facility] in 11/2019 was normal. 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 Lamotrigine was stopped  initially with some concern for non-compliance, then restarted. She is on Briviact 50mg  BID and Lamotrigine 100mg  BID with no side effects.  Since her last visit with Pediatric Neurology, she reports a nocturnal seizure on 08/24/2020. Prior to this, the last seizure was in 11/2019. She denies missing medications. Her boyfriend her heard jerking and grunting, she bit her tongue really bad. She recalls having a bad migraine all day with no response to 4 doses of Tylenol. She usually has a bad migraine before her seizures. She notes sleep is an issue. She works first shift at 10/25/2020, from 5am to 2pm, she tries to be in bed at 10pm but sleeps around 5 hours. She denies any recent alcohol use. She denies any daytime seizures. She denies any staring/unresponsive episodes, gaps in time, olfactory/gustatory hallucinations, deja vu, rising epigastric sensation, focal numbness/tingling/weakness, myoclonic jerks. She has migraines a couple of times a month with associated nausea and sensitivity to lights/sounds. She usually takes a Tylenol with good response. She states she has not been taking the sumatriptan. She reports a history of enuresis as a child, this stopped but started back up again. She had a couple of incidents this year with no associated seizure activity reported by her boyfriend. She has seen her gynecologist and told her bladder muscles are weak. She denies any dizziness, diplopia, dysarthria/dysphagia, neck/back pain, bowel dysfunction. Memory is concerning for her, she usually notices it at home when she goes to get something and grabs something else. This does not affect work. She denies getting lost driving, missing medications or bill payments. She lives with her boyfriend and uses condoms for contraception. She had a fall in April and fractured  her left ankle, still in a boot.  Epilepsy Risk Factors:  She had a normal birth and early development.  There is no history of febrile convulsions, CNS  infections such as meningitis/encephalitis, significant traumatic brain injury, neurosurgical procedures, or family history of seizures.  Prior AEDs: Keppra (rhabdomyolysis), Lamotrigine  EEGs: EEG in 2012 and 2013 were normal.  She had a 48-hour EEG which captured 12 electrographic seizures lasting 45 to 102 seconds with central 6 Hz theta range activity localized to either side with secondary generalization and a generalized discharge to 20 Hz beta range activity shifting to 6 Hz generalized theta activity followed by delta slowing. Shifting frequencies indicated it was seizure as opposed to rhythmic mid-temporal discharge. EEG in 2019 was normal.  MRI:  Brain MRI at [facility] in 11/2019 was normal.   PAST MEDICAL HISTORY: Past Medical History:  Diagnosis Date   Headache    Seizure (HCC)     PAST SURGICAL HISTORY: No past surgical history on file.  MEDICATIONS: Current Outpatient Medications on File Prior to Visit  Medication Sig Dispense Refill   BRIVIACT 50 MG TABS Take 1 tablet twice per day 60 tablet 5   lamoTRIgine (LAMICTAL) 100 MG tablet Take 1 tablet twice per day 60 tablet 5   SUMAtriptan (IMITREX) 25 MG tablet Take 1 tablet at onset of migraine along with Ibuprofen 400mg . May repeat once in 2 hours if headache persists or recurs. 9 tablet 5   traMADol (ULTRAM) 50 MG tablet Take 1 tablet (50 mg total) by mouth every 6 (six) hours as needed. 15 tablet 0   No current facility-administered medications on file prior to visit.    ALLERGIES: Allergies  Allergen Reactions   Keppra [Levetiracetam] Other (See Comments)    Rhabdomyolysis   Oseltamivir Phosphate Other (See Comments)    Family relates connection between tamiflu and seizures    FAMILY HISTORY: Family History  Problem Relation Age of Onset   Migraines Mother    Migraines Maternal Grandmother    Heart disease Maternal Grandfather    Hypertension Maternal Grandfather    Hypertension Maternal Aunt     Migraines Maternal Uncle    Seizures Other        MGA   ADD / ADHD Cousin        Maternal 1 st cousin    SOCIAL HISTORY: Social History   Socioeconomic History   Marital status: Single    Spouse name: Not on file   Number of children: Not on file   Years of education: Not on file   Highest education level: Not on file  Occupational History   Not on file  Tobacco Use   Smoking status: Never   Smokeless tobacco: Never  Substance and Sexual Activity   Alcohol use: No   Drug use: No   Sexual activity: Never  Other Topics Concern   Not on file  Social History Narrative   Anishka is a high [employer].   She attended [employer].   She lives with both parents. She has two older sisters.   She enjoys chilling, shopping, and hanging with friends.   Social Determinants of Health   Financial Resource Strain: Not on file  Food Insecurity: Not on file  Transportation Needs: Not on file  Physical Activity: Not on file  Stress: Not on file  Social Connections: Not on file  Intimate Partner Violence: Not on file     PHYSICAL EXAM: Vitals:   09/04/20 1040  BP: 102/68  Pulse: 68  SpO2: 99%   General: No acute distress Head:  Normocephalic/atraumatic Skin/Extremities: No rash, no edema, left foot in boot Neurological Exam: Mental status: alert and oriented to person, place, and time, no dysarthria or aphasia, Fund of knowledge is appropriate.  Recent and remote memory are intact, 2/3 delayed recall.  Attention and concentration are normal, 5/5 WORLD backwards. Able to name objects and repeat phrases. Cranial nerves: CN I: not tested CN II: pupils equal, round and reactive to light, visual fields intact CN III, IV, VI:  full range of motion, no nystagmus, no ptosis CN V: facial sensation intact CN VII: upper and lower face symmetric CN VIII: hearing intact to conversation Bulk & Tone: normal, no fasciculations. Motor: 5/5 throughout with no pronator  drift. Sensation: intact to light touch, cold, pin, vibration and joint position sense.  No extinction to double simultaneous stimulation.  Romberg test negative Deep Tendon Reflexes: +2 throughout Cerebellar: no incoordination on finger to nose testing Gait: narrow-based and steady, no ataxia Tremor: none   IMPRESSION: This is a 25 year old right-handed woman with a history of migraines and nocturnal seizures presenting to establish care. Neurological exam normal. MRI brain in 11/2019 normal, ambulatory EEG in 2013/2014 captured multiple electrographic seizures that were non-lateralizing. She has exclusively nocturnal seizures, last seizure was 08/24/2020 in the setting of poor sleep. Continue Briviact 50mg  BID and Lamotrigine 100mg  BID. She will try melatonin to help with sleep. Discussed issues in women with epilepsy, recommended discussing better contraception with her gynecologist, start daily folic acid 1mg . Baseline Lamictal level will be done today. Migraines are episodic, instructed to take prn sumatriptan at migraine onset.   driving laws were discussed with the patient, and she knows to stop driving after a seizure, until 6 months seizure-free. We discussed avoidance of seizure triggers, including missing medication, sleep deprivation, alcohol. Would avoid Tramadol, she has only taken it one time since her ankle surgery. Follow-up in 6 months, she knows to call for any changes.   Thank you for allowing me to participate in the care of this patient. Please do not hesitate to call for any questions or concerns.   , M.D.  CC: , NP, Dr. 

## 2020-09-04 NOTE — Patient Instructions (Addendum)
Good to meet you!  Bloodwork for Lamictal level  2. Continue Briviact 50mg  twice a day  3. Continue Lamotrigine 100mg  twice a day  4. Start a daily folic acid 1mg  supplement  5. Can start melatonin 3mg  after dinner to see if this helps with sleep   6. Discuss birth control options with your gynecologist  7. Follow-up in 6 months, call for any changes   Seizure Precautions: 1. If medication has been prescribed for you to prevent seizures, take it exactly as directed.  Do not stop taking the medicine without talking to your doctor first, even if you have not had a seizure in a long time.   2. Avoid activities in which a seizure would cause danger to yourself or to others.  Don't operate dangerous machinery, swim alone, or climb in high or dangerous places, such as on ladders, roofs, or girders.  Do not drive unless your doctor says you may.  3. If you have any warning that you may have a seizure, lay down in a safe place where you can't hurt yourself.    4.  No driving for 6 months from last seizure, as per [facility].   Please refer to the following link on the Epilepsy Foundation of America's website for more information: http://www.epilepsyfoundation.org/answerplace/Social/driving/drivingu.cfm   5.  Maintain good sleep hygiene. Avoid alcohol.  6.  Notify your neurology if you are planning pregnancy or if you become pregnant.  7.  Contact your doctor if you have any problems that may be related to the medicine you are taking.  8.  Call 911 and bring the patient back to the ED if:        A.  The seizure lasts longer than 5 minutes.       B.  The patient doesn't awaken shortly after the seizure  C.  The patient has new problems such as difficulty seeing, speaking or moving  D.  The patient was injured during the seizure  E.  The patient has a temperature over 102 F (39C)  F.  The patient vomited and now is having trouble breathing

## 2020-09-05 NOTE — Telephone Encounter (Signed)
Pt called no answer voice mail left to call the office back  

## 2020-09-05 NOTE — Telephone Encounter (Signed)
Pls call patient to come to the lab when she can to have lamictal level done first thing in the morning before she takes her first dose of medication. Thanks

## 2020-09-05 NOTE — Telephone Encounter (Signed)
For your information  

## 2020-09-06 NOTE — Telephone Encounter (Signed)
Pt called per DPR left a voice mail come to the lab when she can to have lamictal level done first thing in the morning before she takes her first dose of medication.

## 2021-04-10 ENCOUNTER — Other Ambulatory Visit: Payer: Self-pay | Admitting: Neurology

## 2021-04-10 DIAGNOSIS — G40309 Generalized idiopathic epilepsy and epileptic syndromes, not intractable, without status epilepticus: Secondary | ICD-10-CM

## 2021-04-10 DIAGNOSIS — G40209 Localization-related (focal) (partial) symptomatic epilepsy and epileptic syndromes with complex partial seizures, not intractable, without status epilepticus: Secondary | ICD-10-CM

## 2021-04-23 ENCOUNTER — Ambulatory Visit: Payer: 59 | Admitting: Neurology

## 2021-10-09 LAB — OB RESULTS CONSOLE RPR: RPR: NONREACTIVE

## 2021-10-09 LAB — OB RESULTS CONSOLE RUBELLA ANTIBODY, IGM: Rubella: IMMUNE

## 2021-10-09 LAB — OB RESULTS CONSOLE HEPATITIS B SURFACE ANTIGEN: Hepatitis B Surface Ag: NEGATIVE

## 2021-10-09 LAB — OB RESULTS CONSOLE GC/CHLAMYDIA
Chlamydia: NEGATIVE
Neisseria Gonorrhea: NEGATIVE

## 2021-10-09 LAB — OB RESULTS CONSOLE HIV ANTIBODY (ROUTINE TESTING): HIV: NONREACTIVE

## 2021-12-10 ENCOUNTER — Other Ambulatory Visit: Payer: Self-pay | Admitting: Neurology

## 2021-12-10 DIAGNOSIS — G40309 Generalized idiopathic epilepsy and epileptic syndromes, not intractable, without status epilepticus: Secondary | ICD-10-CM

## 2021-12-10 DIAGNOSIS — G40209 Localization-related (focal) (partial) symptomatic epilepsy and epileptic syndromes with complex partial seizures, not intractable, without status epilepticus: Secondary | ICD-10-CM

## 2021-12-10 DIAGNOSIS — R569 Unspecified convulsions: Secondary | ICD-10-CM

## 2021-12-30 ENCOUNTER — Telehealth: Payer: Self-pay | Admitting: Neurology

## 2021-12-30 ENCOUNTER — Other Ambulatory Visit: Payer: Self-pay | Admitting: Neurology

## 2021-12-30 DIAGNOSIS — R569 Unspecified convulsions: Secondary | ICD-10-CM

## 2021-12-30 DIAGNOSIS — G40209 Localization-related (focal) (partial) symptomatic epilepsy and epileptic syndromes with complex partial seizures, not intractable, without status epilepticus: Secondary | ICD-10-CM

## 2021-12-30 DIAGNOSIS — G40309 Generalized idiopathic epilepsy and epileptic syndromes, not intractable, without status epilepticus: Secondary | ICD-10-CM

## 2021-12-30 DIAGNOSIS — Z79899 Other long term (current) drug therapy: Secondary | ICD-10-CM

## 2021-12-30 NOTE — Telephone Encounter (Signed)
Pt called advised she needs to ask her PCP to fill her medication until she is seen by Dr Delice Lesch she said she doesn't know who her PCP is , Pt asked to placed on a wait list,

## 2021-12-30 NOTE — Telephone Encounter (Signed)
Seen 08/2020 has an appointment for 07/18/2022 sending  to make sure if we can refill,

## 2021-12-30 NOTE — Telephone Encounter (Signed)
Pt's mother called in wanting a refill of the pt's briviact (30 day supply) and lamotrigine (90 day supply) sent to [place] [facility] in [place]. I scheduled a follow up appt.   She states her daughter is currently 5 months pregnant.

## 2021-12-31 NOTE — Telephone Encounter (Signed)
Pls let her know I sent refills until her appt with me. Since she is pregnant (pls send my congratulations), she will need monthly lamictal level done because levels in the body tend to drop when we are pregnant, needing to increase dose if level is low. Pls have her come for Lamictal level first thing in the morning before she takes first dose, thanks

## 2021-12-31 NOTE — Addendum Note (Signed)
Addended by: Jake Seats on: 12/31/2021 01:24 PM   Modules accepted: Orders

## 2021-12-31 NOTE — Telephone Encounter (Signed)
Pt called informed that Dr Delice Lesch sent refills until her appt  Since she is pregnan she will need monthly lamictal level done because levels in the body tend to drop when we are pregnant, needing to increase dose if level is low. Pls  come for Lamictal level first thing in the morning before she takes first dose. Pt stated she takes 200mg  at night, When trying to tell the pt she needs to come in later in the after noon she hung the phone up on me, lab order placed in epic so if pt comes in she can have them done,

## 2021-12-31 NOTE — Telephone Encounter (Signed)
Pt called no answer left a voice mail to call back  

## 2022-02-24 NOTE — L&D Delivery Note (Signed)
Delivery Note  Patient arrived to labor and delivery with fast progression en route, upon entry into room head was crowning. She had just had IV placed and not received any antibiotic for GBS PPx  At 7:21 AM a viable female was delivered via Vaginal, Spontaneous (Presentation: Left Occiput Anterior).  APGAR: 8, 9; weight  .   Placenta status: Spontaneous, Intact.  Cord: 3 vessels with the following complications: None.  Anesthesia: None Episiotomy: None Lacerations: 2nd degree Suture Repair: 2.0 vicryl rapide, local anesthesia given Est. Blood Loss (mL): 206  Mom to postpartum.  Baby to Couplet care / Skin to Skin.  Carlyon Shadow 04/06/2022, 7:56 AM

## 2022-03-19 ENCOUNTER — Ambulatory Visit (INDEPENDENT_AMBULATORY_CARE_PROVIDER_SITE_OTHER): Payer: Commercial Managed Care - PPO | Admitting: Neurology

## 2022-03-19 ENCOUNTER — Encounter: Payer: Self-pay | Admitting: Neurology

## 2022-03-19 DIAGNOSIS — G40209 Localization-related (focal) (partial) symptomatic epilepsy and epileptic syndromes with complex partial seizures, not intractable, without status epilepticus: Secondary | ICD-10-CM

## 2022-03-19 LAB — OB RESULTS CONSOLE GBS
GBS: POSITIVE
GBS: POSITIVE

## 2022-03-19 MED ORDER — LAMOTRIGINE 100 MG PO TABS: ORAL_TABLET | ORAL | 3 refills | Status: DC

## 2022-03-19 MED ORDER — BRIVIACT 50 MG PO TABS: 1.0000 | ORAL_TABLET | Freq: Two times a day (BID) | ORAL | 3 refills | Status: DC
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
## 2022-03-19 NOTE — Patient Instructions (Signed)
Good to see you doing well. Continue all your medications. Wishing you all the best! Follow-up in 6 months, call for any changes.   Seizure Precautions: 1. If medication has been prescribed for you to prevent seizures, take it exactly as directed.  Do not stop taking the medicine without talking to your doctor first, even if you have not had a seizure in a long time.   2. Avoid activities in which a seizure would cause danger to yourself or to others.  Don't operate dangerous machinery, swim alone, or climb in high or dangerous places, such as on ladders, roofs, or girders.  Do not drive unless your doctor says you may.  3. If you have any warning that you may have a seizure, lay down in a safe place where you can't hurt yourself.    4.  No driving for 6 months from last seizure, as per [facility].   Please refer to the following link on the [facility] website for more information: http://[email or web address].org/[email or web address]/driving/drivingu.cfm   5.  Maintain good sleep hygiene. Avoid alcohol.  6.  Notify your neurology if you are planning pregnancy or if you become pregnant.  7.  Contact your doctor if you have any problems that may be related to the medicine you are taking.  8.  Call 911 and bring the patient back to the ED if:        A.  The seizure lasts longer than 5 minutes.       B.  The patient doesn't awaken shortly after the seizure  C.  The patient has new problems such as difficulty seeing, speaking or moving  D.  The patient was injured during the seizure  E.  The patient has a temperature over 102 F (39C)  F.  The patient vomited and now is having trouble breathing

## 2022-04-06 ENCOUNTER — Other Ambulatory Visit: Payer: Self-pay

## 2022-04-06 ENCOUNTER — Inpatient Hospital Stay (HOSPITAL_COMMUNITY)
Admission: AD | Admit: 2022-04-06 | Discharge: 2022-04-07 | DRG: 807 | Disposition: A | Discharge status: Home / Self Care | Payer: Commercial Managed Care - PPO | Attending: Obstetrics and Gynecology | Admitting: Obstetrics and Gynecology

## 2022-04-06 ENCOUNTER — Encounter (HOSPITAL_COMMUNITY): Payer: Self-pay

## 2022-04-06 DIAGNOSIS — O26893 Other specified pregnancy related conditions, third trimester: Secondary | ICD-10-CM | POA: Diagnosis present

## 2022-04-06 DIAGNOSIS — G40909 Epilepsy, unspecified, not intractable, without status epilepticus: Secondary | ICD-10-CM | POA: Diagnosis present

## 2022-04-06 DIAGNOSIS — O99824 Streptococcus B carrier state complicating childbirth: Secondary | ICD-10-CM | POA: Diagnosis present

## 2022-04-06 DIAGNOSIS — Z3A38 38 weeks gestation of pregnancy: Secondary | ICD-10-CM

## 2022-04-06 DIAGNOSIS — Z3490 Encounter for supervision of normal pregnancy, unspecified, unspecified trimester: Secondary | ICD-10-CM

## 2022-04-06 DIAGNOSIS — O99354 Diseases of the nervous system complicating childbirth: Secondary | ICD-10-CM | POA: Diagnosis present

## 2022-04-06 LAB — CBC
HCT: 33.7 % — ABNORMAL LOW (ref 36.0–46.0)
Hemoglobin: 10.8 g/dL — ABNORMAL LOW (ref 12.0–15.0)
MCH: 25.4 pg — ABNORMAL LOW (ref 26.0–34.0)
MCHC: 32 g/dL (ref 30.0–36.0)
MCV: 79.3 fL — ABNORMAL LOW (ref 80.0–100.0)
Platelets: 370 10*3/uL (ref 150–400)
RBC: 4.25 MIL/uL (ref 3.87–5.11)
RDW: 14.1 % (ref 11.5–15.5)
WBC: 10.1 10*3/uL (ref 4.0–10.5)
nRBC: 0 % (ref 0.0–0.2)

## 2022-04-06 LAB — POCT FERN TEST: POCT Fern Test: POSITIVE

## 2022-04-06 LAB — TYPE AND SCREEN
ABO/RH(D): O POS
Antibody Screen: NEGATIVE

## 2022-04-06 LAB — HIV ANTIBODY (ROUTINE TESTING W REFLEX): HIV Screen 4th Generation wRfx: NONREACTIVE

## 2022-04-06 MED ORDER — PENICILLIN G POT IN DEXTROSE 60000 UNIT/ML IV SOLN: 3.0000 10*6.[IU] | INTRAVENOUS | Status: DC

## 2022-04-06 MED ORDER — FOLIC ACID 1 MG PO TABS
1.0000 mg | ORAL_TABLET | Freq: Every day | ORAL | Status: DC
  Administered 2022-04-06 – 2022-04-07 (×2): 1 mg via ORAL
  Filled 2022-04-06 (×2): qty 1

## 2022-04-06 MED ORDER — DIBUCAINE (PERIANAL) 1 % EX OINT: 1.0000 | TOPICAL_OINTMENT | CUTANEOUS | Status: DC | PRN

## 2022-04-06 MED ORDER — WITCH HAZEL-GLYCERIN EX PADS: 1.0000 | MEDICATED_PAD | CUTANEOUS | Status: DC | PRN

## 2022-04-06 MED ORDER — ZOLPIDEM TARTRATE 5 MG PO TABS: 5.0000 mg | ORAL_TABLET | Freq: Every evening | ORAL | Status: DC | PRN

## 2022-04-06 MED ORDER — SOD CITRATE-CITRIC ACID 500-334 MG/5ML PO SOLN: 30.0000 mL | ORAL | Status: DC | PRN

## 2022-04-06 MED ORDER — OXYTOCIN-SODIUM CHLORIDE 30-0.9 UT/500ML-% IV SOLN
INTRAVENOUS | Status: AC
  Filled 2022-04-06: qty 500

## 2022-04-06 MED ORDER — LACTATED RINGERS IV SOLN: 500.0000 mL | INTRAVENOUS | Status: DC | PRN

## 2022-04-06 MED ORDER — TETANUS-DIPHTH-ACELL PERTUSSIS 5-2.5-18.5 LF-MCG/0.5 IM SUSY: 0.5000 mL | PREFILLED_SYRINGE | Freq: Once | INTRAMUSCULAR | Status: DC

## 2022-04-06 MED ORDER — BRIVARACETAM 25 MG PO TABS
50.0000 mg | ORAL_TABLET | Freq: Two times a day (BID) | ORAL | Status: DC
  Administered 2022-04-06 – 2022-04-07 (×3): 50 mg via ORAL
  Filled 2022-04-06 (×3): qty 2

## 2022-04-06 MED ORDER — BENZOCAINE-MENTHOL 20-0.5 % EX AERO
1.0000 | INHALATION_SPRAY | CUTANEOUS | Status: DC | PRN
  Filled 2022-04-06: qty 56

## 2022-04-06 MED ORDER — ACETAMINOPHEN 325 MG PO TABS
650.0000 mg | ORAL_TABLET | ORAL | Status: DC | PRN
  Administered 2022-04-06: 650 mg via ORAL
  Filled 2022-04-06 (×2): qty 2

## 2022-04-06 MED ORDER — HYDROXYZINE HCL 50 MG PO TABS: 50.0000 mg | ORAL_TABLET | Freq: Four times a day (QID) | ORAL | Status: DC | PRN

## 2022-04-06 MED ORDER — OXYCODONE-ACETAMINOPHEN 5-325 MG PO TABS: 1.0000 | ORAL_TABLET | ORAL | Status: DC | PRN

## 2022-04-06 MED ORDER — IBUPROFEN 600 MG PO TABS
600.0000 mg | ORAL_TABLET | Freq: Four times a day (QID) | ORAL | Status: DC
  Administered 2022-04-06: 600 mg via ORAL
  Filled 2022-04-06 (×2): qty 1

## 2022-04-06 MED ORDER — LIDOCAINE HCL (PF) 1 % IJ SOLN: 30.0000 mL | INTRAMUSCULAR | Status: DC | PRN

## 2022-04-06 MED ORDER — PRENATAL MULTIVITAMIN CH: 1.0000 | ORAL_TABLET | Freq: Every day | ORAL | Status: DC

## 2022-04-06 MED ORDER — COCONUT OIL OIL: 1.0000 | TOPICAL_OIL | Status: DC | PRN

## 2022-04-06 MED ORDER — DIPHENHYDRAMINE HCL 25 MG PO CAPS: 25.0000 mg | ORAL_CAPSULE | Freq: Four times a day (QID) | ORAL | Status: DC | PRN

## 2022-04-06 MED ORDER — FENTANYL CITRATE (PF) 100 MCG/2ML IJ SOLN: 100.0000 ug | Freq: Once | INTRAMUSCULAR | Status: DC

## 2022-04-06 MED ORDER — ONDANSETRON HCL 4 MG PO TABS: 4.0000 mg | ORAL_TABLET | ORAL | Status: DC | PRN

## 2022-04-06 MED ORDER — LIDOCAINE HCL (PF) 1 % IJ SOLN
INTRAMUSCULAR | Status: AC
  Filled 2022-04-06: qty 30

## 2022-04-06 MED ORDER — SODIUM CHLORIDE 0.9 % IV SOLN: 5.0000 10*6.[IU] | Freq: Once | INTRAVENOUS | Status: DC

## 2022-04-06 MED ORDER — OXYTOCIN-SODIUM CHLORIDE 30-0.9 UT/500ML-% IV SOLN: 2.5000 [IU]/h | INTRAVENOUS | Status: DC

## 2022-04-06 MED ORDER — SENNOSIDES-DOCUSATE SODIUM 8.6-50 MG PO TABS: 2.0000 | ORAL_TABLET | ORAL | Status: DC

## 2022-04-06 MED ORDER — LAMOTRIGINE 100 MG PO TABS
100.0000 mg | ORAL_TABLET | Freq: Two times a day (BID) | ORAL | Status: DC
  Administered 2022-04-06 – 2022-04-07 (×3): 100 mg via ORAL
  Filled 2022-04-06 (×3): qty 1

## 2022-04-06 MED ORDER — LACTATED RINGERS IV SOLN: INTRAVENOUS | Status: DC

## 2022-04-06 MED ORDER — ONDANSETRON HCL 4 MG/2ML IJ SOLN: 4.0000 mg | Freq: Four times a day (QID) | INTRAMUSCULAR | Status: DC | PRN

## 2022-04-06 MED ORDER — OXYCODONE-ACETAMINOPHEN 5-325 MG PO TABS: 2.0000 | ORAL_TABLET | ORAL | Status: DC | PRN

## 2022-04-06 MED ORDER — SIMETHICONE 80 MG PO CHEW: 80.0000 mg | CHEWABLE_TABLET | ORAL | Status: DC | PRN

## 2022-04-06 MED ORDER — ONDANSETRON HCL 4 MG/2ML IJ SOLN: 4.0000 mg | INTRAMUSCULAR | Status: DC | PRN

## 2022-04-06 MED ORDER — OXYTOCIN BOLUS FROM INFUSION: 333.0000 mL | Freq: Once | INTRAVENOUS | Status: DC

## 2022-04-06 MED ORDER — ACETAMINOPHEN 325 MG PO TABS
650.0000 mg | ORAL_TABLET | ORAL | Status: DC | PRN
  Administered 2022-04-06: 650 mg via ORAL
  Filled 2022-04-06: qty 2

## 2022-04-06 NOTE — Lactation Note (Signed)
This note was copied from a baby's chart. Lactation Consultation Note  Patient Name: Olivia Castillo [id number] Date: 04/06/2022 Reason for consult: Initial assessment;Early term 37-38.6wks;1st time breastfeeding Age:25 hours  P1, Called by RN in L&D regarding mother's medications for seizures.  Mother is taking Lamictal L2 & Briviact L3.   Provided information to mother to read & discuss with Ped MD from "[facility] Medications & Mothers' Milk".  Mother states she would like to breastfeed and formula feed.  Baby recently took 10 ml of formula via slow flow nipple bottle.  Recommend offering breast before formula.  Offered to assist with breastfeeding and mother declined at this time. Provided volume guidelines and discussed paced feeding. Suggest calling for help as needed.    Maternal Data Does the patient have breastfeeding experience prior to this delivery?: No  Feeding Mother's Current Feeding Choice: Breast Milk and Formula  LATCH Score Latch: Too sleepy or reluctant, no latch achieved, no sucking elicited.  Audible Swallowing: None  Type of Nipple: Everted at rest and after stimulation  Comfort (Breast/Nipple): Soft / non-tender  Hold (Positioning): Assistance needed to correctly position infant at breast and maintain latch.  LATCH Score: 5  Interventions Interventions: Education;LC Services brochure  Consult Status Consult Status: Follow-up Date: 04/06/22 Follow-up type: In-patient    Vivianne Master [facility] 04/06/2022, 10:30 AM

## 2022-04-06 NOTE — Plan of Care (Signed)
  Problem: Education: Goal: Knowledge of Childbirth will improve Outcome: Progressing Goal: Ability to make informed decisions regarding treatment and plan of care will improve Outcome: Progressing Goal: Ability to state and carry out methods to decrease the pain will improve Outcome: Progressing Goal: Individualized Educational Video(s) Outcome: Progressing   Problem: Coping: Goal: Ability to verbalize concerns and feelings about labor and delivery will improve Outcome: Progressing   Problem: Life Cycle: Goal: Ability to make normal progression through stages of labor will improve Outcome: Progressing Goal: Ability to effectively push during vaginal delivery will improve Outcome: Progressing   Problem: Role Relationship: Goal: Will demonstrate positive interactions with the child Outcome: Progressing   Problem: Safety: Goal: Risk of complications during labor and delivery will decrease Outcome: Progressing   Problem: Pain Management: Goal: Relief or control of pain from uterine contractions will improve Outcome: Progressing   Problem: Education: Goal: Knowledge of General Education information will improve Description: Including pain rating scale, medication(s)/side effects and non-pharmacologic comfort measures Outcome: Progressing   Problem: Health Behavior/Discharge Planning: Goal: Ability to manage health-related needs will improve Outcome: Progressing   Problem: Clinical Measurements: Goal: Ability to maintain clinical measurements within normal limits will improve Outcome: Progressing Goal: Will remain free from infection Outcome: Progressing Goal: Diagnostic test results will improve Outcome: Progressing Goal: Respiratory complications will improve Outcome: Progressing Goal: Cardiovascular complication will be avoided Outcome: Progressing   Problem: Activity: Goal: Risk for activity intolerance will decrease Outcome: Progressing   Problem:  Nutrition: Goal: Adequate nutrition will be maintained Outcome: Progressing   Problem: Coping: Goal: Level of anxiety will decrease Outcome: Progressing   Problem: Elimination: Goal: Will not experience complications related to bowel motility Outcome: Progressing Goal: Will not experience complications related to urinary retention Outcome: Progressing   Problem: Pain Managment: Goal: General experience of comfort will improve Outcome: Progressing   Problem: Safety: Goal: Ability to remain free from injury will improve Outcome: Progressing   Problem: Skin Integrity: Goal: Risk for impaired skin integrity will decrease Outcome: Progressing   Problem: Education: Goal: Expressions of having a comfortable level of knowledge regarding the disease process will increase Outcome: Progressing   Problem: Coping: Goal: Ability to adjust to condition or change in health will improve Outcome: Progressing Goal: Ability to identify appropriate support needs will improve Outcome: Progressing   Problem: Health Behavior/Discharge Planning: Goal: Compliance with prescribed medication regimen will improve Outcome: Progressing   Problem: Medication: Goal: Risk for medication side effects will decrease Outcome: Progressing   Problem: Clinical Measurements: Goal: Complications related to the disease process, condition or treatment will be avoided or minimized Outcome: Progressing Goal: Diagnostic test results will improve Outcome: Progressing   Problem: Safety: Goal: Verbalization of understanding the information provided will improve Outcome: Progressing   Problem: Self-Concept: Goal: Level of anxiety will decrease Outcome: Progressing Goal: Ability to verbalize feelings about condition will improve Outcome: Progressing

## 2022-04-06 NOTE — H&P (Signed)
OB History and Physical   Olivia Castillo is a 27 y.o. female G1P0 presenting for contractions and LOF at [redacted]w[redacted]d admitted for labor, SROM.   She has a history of epilepsy, last seizure in 2022.  She has been on lamotrigine and briviact and has seen neurology as recent as 03/19/22.  Most recent cervical exam was 4/80/-2 in office.  Rh positive, GBS positive (03/19/22), panorama low risk.    OB History     Gravida  1   Para      Term      Preterm      AB      Living         SAB      IAB      Ectopic      Multiple      Live Births             Past Medical History:  Diagnosis Date   Headache    Seizure ([facility]    History reviewed. No pertinent surgical history. Family History: family history includes ADD / ADHD in her cousin; Heart disease in her maternal grandfather; Hypertension in her maternal aunt and maternal grandfather; Migraines in her maternal grandmother, maternal uncle, and mother; Seizures in an other family member. Social History:  reports that she has never smoked. She has never used smokeless tobacco. She reports that she does not drink alcohol and does not use drugs.     Maternal Diabetes: No Genetic Screening: Normal Maternal Ultrasounds/Referrals: Normal Fetal Ultrasounds or other Referrals:  None Maternal Substance Abuse:  No Significant Maternal Medications:  lamictal and briviact Significant Maternal Lab Results:  Group B Strep positive Other Comments:  None  Review of Systems - Patient denies fever, chills, SOB, CP, N/V/D.  History Dilation: 6 Effacement (%): 80 Station: -1 Exam by:: D Simpson CNM Blood pressure 118/88, pulse 86, temperature 98.2 F (36.8 C), temperature source Oral, resp. rate 18, height 5' 3"$  (1.6 m), weight 95.8 kg, SpO2 98 %. Exam Physical Exam   Gen: alert, well appearing, no distress Chest: nonlabored breathing CV: no peripheral edema Abdomen: ctx present Ext: no evidence of DVT  Prenatal labs: ABO,  Rh:   Antibody:   Rubella:   RPR:    HBsAg:    HIV:    GBS:     Assessment/Plan: Admit to Labor and Delivery GBS positive, no PCN allergy Epidural when desired Augment if needed after 4 hrs of PCN.   TCarlyon Shadow2/12/2022, 7:15 AM

## 2022-04-06 NOTE — MAU Note (Addendum)
..  Olivia Castillo is a 27 y.o. at [redacted]w[redacted]d here in MAU reporting: leaking of fluid and contractions since 4 am, contractions are now every 2 minutes.  Reports some bloody show. +FM   Onset of complaint: 2hrs ago  Pain score: 7/10  [id number] in room  Lab orders placed from triage:  MAU labor  Was 4 cm on 04/01/2022

## 2022-04-07 ENCOUNTER — Encounter (HOSPITAL_COMMUNITY): Payer: Self-pay | Admitting: Obstetrics and Gynecology

## 2022-04-07 LAB — CBC
HCT: 29.7 % — ABNORMAL LOW (ref 36.0–46.0)
Hemoglobin: 9.2 g/dL — ABNORMAL LOW (ref 12.0–15.0)
MCH: 25.3 pg — ABNORMAL LOW (ref 26.0–34.0)
MCHC: 31 g/dL (ref 30.0–36.0)
MCV: 81.6 fL (ref 80.0–100.0)
Platelets: 290 10*3/uL (ref 150–400)
RBC: 3.64 MIL/uL — ABNORMAL LOW (ref 3.87–5.11)
RDW: 13.9 % (ref 11.5–15.5)
WBC: 13.2 10*3/uL — ABNORMAL HIGH (ref 4.0–10.5)
nRBC: 0 % (ref 0.0–0.2)

## 2022-04-07 LAB — RPR: RPR Ser Ql: NONREACTIVE

## 2022-04-07 NOTE — Discharge Summary (Signed)
Postpartum Discharge Summary    Patient Name: Olivia Castillo DOB: [birth year] MRN: [id number]  Date of admission: 04/06/2022 Delivery date:04/06/2022  Delivering provider: Eyvonne Mechanic A  Date of discharge: 04/07/2022  Admitting diagnosis: Pregnancy [Z34.90] Intrauterine pregnancy: [redacted]w[redacted]d    Secondary diagnosis:  Principal Problem:   Pregnancy  Additional problems: Seizure disorder requiring meds    Discharge diagnosis: Term Pregnancy Delivered                                              Post partum procedures: None Augmentation:  None Complications: None  Hospital course: Onset of Labor With Vaginal Delivery      27y.o. yo G2P1011 at 346w5das admitted in Active Labor on 04/06/2022. Labor course was complicated by SROM with labor.   Membrane Rupture Time/Date: 4:00 AM ,04/06/2022   Delivery Method:Vaginal, Spontaneous  Episiotomy: None  Lacerations:  2nd degree  Patient had a postpartum course complicated by none.  She is ambulating, tolerating a regular diet, passing flatus, and urinating well. Patient is discharged home in stable condition on 04/07/22.  Newborn Data: Birth date:04/06/2022  Birth time:7:21 AM  Gender:Female  Living status:Living  Apgars:8 ,9  Weight:2890 g   Magnesium Sulfate received: No BMZ received: No Rhophylac:N/A MMR:N/A T-DaP:Given prenatally Flu: N/A Transfusion:No  Physical exam  Vitals:   04/06/22 2006 04/07/22 0001 04/07/22 0529 04/07/22 0839  BP: 120/83 113/75 127/84 125/88  Pulse: (!) 103 88 80 82  Resp: 17 18 17 16  $ Temp: 98.6 F (37 C) 98.2 F (36.8 C) 98.2 F (36.8 C) 98.1 F (36.7 C)  TempSrc: Oral Oral Oral Oral  SpO2: 100% 98% 100%   Weight:      Height:       General: alert Lochia: appropriate Uterine Fundus: firm Incision: N/A DVT Evaluation: No evidence of DVT seen on physical exam. Labs: Lab Results  Component Value Date   WBC 13.2 (H) 04/07/2022   HGB 9.2 (L) 04/07/2022   HCT 29.7 (L) 04/07/2022    MCV 81.6 04/07/2022   PLT 290 04/07/2022      Latest Ref Rng & Units 01/10/2020   12:00 AM  CMP  Glucose 65 - 99 mg/dL 91   BUN 7 - 25 mg/dL 9   Creatinine 0.50 - 1.10 mg/dL 0.70   Sodium 135 - 146 mmol/L 136   Potassium 3.5 - 5.3 mmol/L 4.5   Chloride 98 - 110 mmol/L 103   CO2 20 - 32 mmol/L 24   Calcium 8.6 - 10.2 mg/dL 9.5   Total Protein 6.1 - 8.1 g/dL 6.6   Total Bilirubin 0.2 - 1.2 mg/dL 0.6   AST 10 - 30 U/L 14   ALT 6 - 29 U/L 12    Edinburgh Score:    04/07/2022   12:02 AM  Edinburgh Postnatal Depression Scale Screening Tool  I have been able to laugh and see the funny side of things. 0  I have looked forward with enjoyment to things. 0  I have blamed myself unnecessarily when things went wrong. 0  I have been anxious or worried for no good reason. 0  I have felt scared or panicky for no good reason. 0  Things have been getting on top of me. 0  I have been so unhappy that I have had difficulty sleeping. 0  I  have felt sad or miserable. 0  I have been so unhappy that I have been crying. 0  The thought of harming myself has occurred to me. 0  Edinburgh Postnatal Depression Scale Total 0      After visit meds:  Allergies as of 04/07/2022       Reactions   Keppra [levetiracetam] Other (See Comments)   Rhabdomyolysis   Oseltamivir Phosphate Other (See Comments)   Family relates connection between tamiflu and seizures        Medication List     STOP taking these medications    folic acid 1 MG tablet Commonly known as: FOLVITE       TAKE these medications    Briviact 50 MG Tabs Generic drug: Brivaracetam Take 1 tablet by mouth 2 (two) times daily.   lamoTRIgine 100 MG tablet Commonly known as: LAMICTAL Take 1 tablet by mouth twice daily         Discharge home in stable condition Infant Feeding: Bottle Infant Disposition:home with mother Discharge instruction: per After Visit Summary and Postpartum booklet. Activity: Advance as  tolerated. Pelvic rest for 6 weeks.  Diet: routine diet Anticipated Birth Control: Unsure Postpartum Appointment:6 weeks Additional Postpartum F/U:  None Future Appointments: Future Appointments  Date Time Provider [place]  10/06/2022 11:30 AM Cameron Sprang, MD [id number] None   Follow up Visit:      04/07/2022 Tyson Dense, MD
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
## 2022-04-07 NOTE — Plan of Care (Signed)
adequate

## 2022-04-07 NOTE — Progress Notes (Signed)
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 Mardelle Matte notified via secure chat.

## 2022-04-07 NOTE — Lactation Note (Signed)
This note was copied from a baby's chart. Lactation Consultation Note  Patient Name: Olivia Castillo [id number] Date: 04/07/2022 Reason for consult: Follow-up assessment Age:25 hours  P1,  Mother attempting to give baby bottle but she states he is sleepy. Suggest unwrapping him for feeding to wake. Grandmother states baby is already sneezing and not to unwrap baby because he is getting sick and might get cold.  Provided education. Mother states she may start pumping once home.  Offered to set up DEBP and mother declined. Reviewed engorgement care and monitoring voids/stools. Suggest calling for help as needed.    Feeding Mother's Current Feeding Choice: Formula   Interventions Interventions: Education  Discharge Discharge Education: Engorgement and breast care;Warning signs for feeding baby  Consult Status Consult Status: Complete Date: 04/07/22    Vivianne Master [facility] 04/07/2022, 11:12 AM

## 2022-04-17 ENCOUNTER — Telehealth (HOSPITAL_COMMUNITY): Payer: Self-pay | Admitting: *Deleted

## 2022-04-17 NOTE — Telephone Encounter (Signed)
Left phone voicemail message.  Odis Hollingshead, RN 04-17-2022 at 4;49pm
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
## 2022-06-22 ENCOUNTER — Ambulatory Visit
Admission: EM | Admit: 2022-06-22 | Discharge: 2022-06-22 | Disposition: A | Discharge status: Home / Self Care | Payer: Commercial Managed Care - PPO | Attending: Urgent Care | Admitting: Urgent Care

## 2022-06-22 DIAGNOSIS — R310 Gross hematuria: Secondary | ICD-10-CM

## 2022-06-22 DIAGNOSIS — N939 Abnormal uterine and vaginal bleeding, unspecified: Secondary | ICD-10-CM | POA: Insufficient documentation

## 2022-06-22 LAB — POCT URINALYSIS DIP (MANUAL ENTRY)
Bilirubin, UA: NEGATIVE
Glucose, UA: NEGATIVE mg/dL
Ketones, POC UA: NEGATIVE mg/dL
Leukocytes, UA: NEGATIVE
Nitrite, UA: NEGATIVE
Protein Ur, POC: NEGATIVE mg/dL
Spec Grav, UA: >=1.03 — AB (ref 1.010–1.025)
Urobilinogen, UA: 0.2 E.U./dL
pH, UA: 6 (ref 5.0–8.0)

## 2022-06-22 NOTE — ED Provider Notes (Signed)
[facility]  Note:  This document was prepared using Conservation officer, historic buildings and may include unintentional dictation errors.  MRN: [id number] DOB: 1995-09-28  Subjective:   Olivia Castillo is a 27 y.o. female presenting for 6-day history of persistent heavy vaginal bleeding.  This is her second menstrual cycle since giving birth February 2024.  Had a normal spontaneous vaginal delivery.  No history of gynecologic disorders, uterine fibroids, ovarian cyst, irregular cycles.  Patient has no concerns for sexually transmitted infections.  Reports that she was just tested.  She has gotten treatment for vaginitis.  No dysuria but she does have urinary frequency.  Patient is not breast-feeding.  No current facility-administered medications for this encounter.  Current Outpatient Medications:    lamoTRIgine (LAMICTAL) 100 MG tablet, Take by mouth., Disp: , Rfl:    BRIVIACT 50 MG TABS, Take 1 tablet by mouth 2 (two) times daily., Disp: 180 tablet, Rfl: 3   lamoTRIgine (LAMICTAL) 100 MG tablet, Take 1 tablet by mouth twice daily, Disp: 180 tablet, Rfl: 3   Allergies  Allergen Reactions   Keppra [Levetiracetam] Other (See Comments)    Rhabdomyolysis   Oseltamivir Phosphate Other (See Comments)    Family relates connection between tamiflu and seizures    Past Medical History:  Diagnosis Date   Headache    Seizure (HCC)      History reviewed. No pertinent surgical history.  Family History  Problem Relation Age of Onset   Migraines Mother    Migraines Maternal Grandmother    Heart disease Maternal Grandfather    Hypertension Maternal Grandfather    Hypertension Maternal Aunt    Migraines Maternal Uncle    Seizures Other        MGA   ADD / ADHD Cousin        Maternal 1 st cousin    Social History   Tobacco Use   Smoking status: Never   Smokeless tobacco: Never  Vaping Use   Vaping Use: Never used  Substance Use Topics   Alcohol use: No    Drug use: Yes    Frequency: 3.0 times per week    Types: Marijuana    ROS   Objective:   Vitals: BP 121/77 (BP Location: Right Arm)   Pulse 78   Temp 99.2 F (37.3 C) (Oral)   Resp 16   LMP 06/17/2022   SpO2 97%   Breastfeeding No   Physical Exam Constitutional:      General: She is not in acute distress.    Appearance: Normal appearance. She is well-developed. She is not ill-appearing, toxic-appearing or diaphoretic.  HENT:     Head: Normocephalic and atraumatic.     Nose: Nose normal.     Mouth/Throat:     Mouth: Mucous membranes are moist.  Eyes:     General: No scleral icterus.       Right eye: No discharge.        Left eye: No discharge.     Extraocular Movements: Extraocular movements intact.     Conjunctiva/sclera: Conjunctivae normal.  Cardiovascular:     Rate and Rhythm: Normal rate.  Pulmonary:     Effort: Pulmonary effort is normal.  Abdominal:     General: Bowel sounds are normal. There is no distension.     Palpations: Abdomen is soft. There is no mass.     Tenderness: There is no abdominal tenderness. There is no right CVA tenderness, left CVA tenderness, guarding  or rebound.  Skin:    General: Skin is warm and dry.  Neurological:     General: No focal deficit present.     Mental Status: She is alert and oriented to person, place, and time.  Psychiatric:        Mood and Affect: Mood normal.        Behavior: Behavior normal.        Thought Content: Thought content normal.        Judgment: Judgment normal.     Results for orders placed or performed during the hospital encounter of 06/22/22 (from the past 24 hour(s))  POCT urinalysis dipstick     Status: Abnormal   Collection Time: 06/22/22  3:11 PM  Result Value Ref Range   Color, UA yellow yellow   Clarity, UA clear clear   Glucose, UA negative negative mg/dL   Bilirubin, UA negative negative   Ketones, POC UA negative negative mg/dL   Spec Grav, UA >=1.610 (A) 1.010 - 1.025   Blood, UA  large (A) negative   pH, UA 6.0 5.0 - 8.0   Protein Ur, POC negative negative mg/dL   Urobilinogen, UA 0.2 0.2 or 1.0 E.U./dL   Nitrite, UA Negative Negative   Leukocytes, UA Negative Negative     Assessment and Plan :   PDMP not reviewed this encounter.  1. Gross hematuria   2. Abnormal vaginal bleeding     Urine culture pending. Patient declined treatment with Megace. Follow up with her OBGYN. Counseled patient on potential for adverse effects with medications prescribed/recommended today, ER and return-to-clinic precautions discussed, patient verbalized understanding.    Wallis Bamberg, [place] 06/22/22 1517

## 2022-06-22 NOTE — Discharge Instructions (Signed)
Follow up with your gynecologist. Make sure you hydrate very well with plain water and a quantity of 80 ounces of water a day.  Please limit drinks that are considered urinary irritants such as soda, sweet tea, coffee, energy drinks, alcohol. I will let you know about your urine culture results through MyChart to see if we need to prescribe or change your antibiotics based off of those results.

## 2022-06-22 NOTE — ED Triage Notes (Signed)
Pt reports menstrual is heavy, today is day #6. Reports this is her second menstrual period after her pregnancy in February, 2024.

## 2022-06-23 LAB — URINE CULTURE: Culture: 3000 — AB

## 2022-07-18 ENCOUNTER — Telehealth: Payer: Self-pay | Admitting: Neurology

## 2022-07-18 ENCOUNTER — Ambulatory Visit: Payer: 59 | Admitting: Neurology

## 2022-07-18 NOTE — Telephone Encounter (Signed)
New message    1. Which medications need to be refilled? (please list name of each medication and dose if known) SUMAtriptan (IMITREX) 25 MG tablet   2. Which pharmacy/location (including street and city if local pharmacy) is medication to be sent to?[facility] - [place] ([place]), [place] - [address]   3. Do they need a 30 day or 90 day supply? 30 day supply

## 2022-07-18 NOTE — Telephone Encounter (Signed)
Pls confirm if she has been taking it and if helpful. If yes, ok to send refills, thanks

## 2022-07-18 NOTE — Telephone Encounter (Signed)
Pt called no answer left a voice mail to see if SUMAtriptan (IMITREX) 25 MG tablet if she is taken it and if it is helping?  

## 2022-07-22 NOTE — Telephone Encounter (Signed)
Pt called no answer left a voice mail to see if SUMAtriptan (IMITREX) 25 MG tablet if she is taken it and if it is helping?

## 2022-07-23 NOTE — Telephone Encounter (Signed)
Pt called no answer left a voce mail will send my chart message as well

## 2022-07-28 NOTE — Telephone Encounter (Signed)
Looks like she has not read message either. Pls mail letter to contact our office regarding Sumatriptan prescription. Thanks

## 2022-07-28 NOTE — Telephone Encounter (Signed)
Letter mailed to pt.,

## 2022-08-01 ENCOUNTER — Telehealth: Payer: Self-pay

## 2022-08-01 ENCOUNTER — Other Ambulatory Visit: Payer: Self-pay | Admitting: Neurology

## 2022-08-01 DIAGNOSIS — G43009 Migraine without aura, not intractable, without status migrainosus: Secondary | ICD-10-CM

## 2022-08-01 MED ORDER — SUMATRIPTAN SUCCINATE 25 MG PO TABS: 25.0000 mg | ORAL_TABLET | Freq: Once | ORAL | 3 refills | Status: DC | PRN

## 2022-08-01 NOTE — Telephone Encounter (Signed)
Pt mother called in for pt because she is at work needs SUMAtriptan (IMITREX) 25 MG tablet  called in. She stated that it does help her, resend RX to [facility] [employer]

## 2022-08-04 NOTE — Telephone Encounter (Signed)
Pt called she picked up her RX this weekend

## 2022-08-04 NOTE — Telephone Encounter (Signed)
Pt called in and left a message with the access nurse. She is returning a call to [facility] about a refill of a prescription.

## 2022-08-13 ENCOUNTER — Other Ambulatory Visit: Payer: Self-pay

## 2022-08-13 ENCOUNTER — Emergency Department (HOSPITAL_COMMUNITY)
Admission: EM | Admit: 2022-08-13 | Discharge: 2022-08-13 | Disposition: A | Discharge status: Home / Self Care | Payer: Commercial Managed Care - PPO | Attending: Emergency Medicine | Admitting: Emergency Medicine

## 2022-08-13 ENCOUNTER — Emergency Department (HOSPITAL_COMMUNITY): Payer: Commercial Managed Care - PPO

## 2022-08-13 DIAGNOSIS — R569 Unspecified convulsions: Secondary | ICD-10-CM | POA: Insufficient documentation

## 2022-08-13 LAB — CBG MONITORING, ED: Glucose-Capillary: 78 mg/dL (ref 70–99)

## 2022-08-13 LAB — CBC WITH DIFFERENTIAL/PLATELET
Abs Immature Granulocytes: 0.03 10*3/uL (ref 0.00–0.07)
Basophils Absolute: 0.1 10*3/uL (ref 0.0–0.1)
Basophils Relative: 1 %
Eosinophils Absolute: 0 10*3/uL (ref 0.0–0.5)
Eosinophils Relative: 0 %
HCT: 44.4 % (ref 36.0–46.0)
Hemoglobin: 13.8 g/dL (ref 12.0–15.0)
Immature Granulocytes: 0 %
Lymphocytes Relative: 16 %
Lymphs Abs: 1.2 10*3/uL (ref 0.7–4.0)
MCH: 26.4 pg (ref 26.0–34.0)
MCHC: 31.1 g/dL (ref 30.0–36.0)
MCV: 84.9 fL (ref 80.0–100.0)
Monocytes Absolute: 0.3 10*3/uL (ref 0.1–1.0)
Monocytes Relative: 4 %
Neutro Abs: 5.7 10*3/uL (ref 1.7–7.7)
Neutrophils Relative %: 79 %
Platelets: 393 10*3/uL (ref 150–400)
RBC: 5.23 MIL/uL — ABNORMAL HIGH (ref 3.87–5.11)
RDW: 16.6 % — ABNORMAL HIGH (ref 11.5–15.5)
WBC: 7.3 10*3/uL (ref 4.0–10.5)
nRBC: 0 % (ref 0.0–0.2)

## 2022-08-13 LAB — BASIC METABOLIC PANEL
Anion gap: 7 (ref 5–15)
BUN: 12 mg/dL (ref 6–20)
CO2: 23 mmol/L (ref 22–32)
Calcium: 8.9 mg/dL (ref 8.9–10.3)
Chloride: 106 mmol/L (ref 98–111)
Creatinine, Ser: 0.83 mg/dL (ref 0.44–1.00)
GFR, Estimated: >60 mL/min (ref >60)
Glucose, Bld: 83 mg/dL (ref 70–99)
Potassium: 3.9 mmol/L (ref 3.5–5.1)
Sodium: 136 mmol/L (ref 135–145)
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
## 2022-08-13 NOTE — ED Notes (Signed)
Urine and urine culture sent to main lab.  
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
## 2022-08-13 NOTE — Discharge Instructions (Addendum)
Please follow-up with your neurologist regarding recent symptoms and ER visit.  Today you were observed for 2 and half hours without symptoms and your labs and imaging were all reassuring.  Please take your medication as prescribed and follow-up with your neurologist.  In the meantime please do not drive, operate machinery, go to the gym and avoid any activities that may be dangerous if you lose consciousness.  If symptoms change or worsen please return to ER.

## 2022-08-18 ENCOUNTER — Telehealth: Payer: Self-pay | Admitting: Neurology
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
## 2022-08-19 ENCOUNTER — Ambulatory Visit (INDEPENDENT_AMBULATORY_CARE_PROVIDER_SITE_OTHER): Payer: Commercial Managed Care - PPO | Admitting: Neurology

## 2022-08-19 ENCOUNTER — Encounter: Payer: Self-pay | Admitting: Neurology

## 2022-08-19 DIAGNOSIS — G40209 Localization-related (focal) (partial) symptomatic epilepsy and epileptic syndromes with complex partial seizures, not intractable, without status epilepticus: Secondary | ICD-10-CM | POA: Diagnosis not present

## 2022-08-19 MED ORDER — BRIVARACETAM 100 MG PO TABS: ORAL_TABLET | ORAL | 3 refills | Status: DC

## 2022-08-19 MED ORDER — LAMOTRIGINE 100 MG PO TABS: ORAL_TABLET | ORAL | 3 refills | Status: DC
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
## 2022-08-19 NOTE — Telephone Encounter (Signed)
Patient seen 08/19/22
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
## 2022-08-20 MED ORDER — BRIVIACT 50 MG PO TABS: ORAL_TABLET | ORAL | 5 refills | Status: DC

## 2022-08-20 NOTE — Addendum Note (Signed)
Addended by: Van Clines on: 08/20/2022 01:08 PM   Modules accepted: Orders

## 2022-10-06 ENCOUNTER — Ambulatory Visit: Payer: 59 | Admitting: Neurology

## 2023-04-27 ENCOUNTER — Encounter: Payer: Self-pay | Admitting: Neurology

## 2023-04-27 ENCOUNTER — Ambulatory Visit (INDEPENDENT_AMBULATORY_CARE_PROVIDER_SITE_OTHER): Payer: 59 | Admitting: Neurology

## 2023-04-27 VITALS — BP 118/77 | HR 102 | Ht 63.0 in | Wt 186.2 lb

## 2023-04-27 DIAGNOSIS — G40209 Localization-related (focal) (partial) symptomatic epilepsy and epileptic syndromes with complex partial seizures, not intractable, without status epilepticus: Secondary | ICD-10-CM

## 2023-04-27 DIAGNOSIS — G43009 Migraine without aura, not intractable, without status migrainosus: Secondary | ICD-10-CM

## 2023-04-27 MED ORDER — LAMOTRIGINE 100 MG PO TABS: ORAL_TABLET | ORAL | 3 refills | Status: DC

## 2023-04-27 MED ORDER — ZONISAMIDE 100 MG PO CAPS: ORAL_CAPSULE | ORAL | 6 refills | Status: DC
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
## 2023-05-19 ENCOUNTER — Other Ambulatory Visit: Payer: Self-pay

## 2023-05-19 ENCOUNTER — Emergency Department (HOSPITAL_COMMUNITY)

## 2023-05-19 ENCOUNTER — Emergency Department (HOSPITAL_COMMUNITY)
Admission: EM | Admit: 2023-05-19 | Discharge: 2023-05-19 | Disposition: A | Discharge status: Home / Self Care | Attending: Emergency Medicine | Admitting: Emergency Medicine

## 2023-05-19 ENCOUNTER — Encounter (HOSPITAL_COMMUNITY): Payer: Self-pay | Admitting: Emergency Medicine

## 2023-05-19 DIAGNOSIS — R569 Unspecified convulsions: Secondary | ICD-10-CM | POA: Diagnosis present

## 2023-05-19 DIAGNOSIS — Y92410 Unspecified street and highway as the place of occurrence of the external cause: Secondary | ICD-10-CM | POA: Diagnosis not present

## 2023-05-19 LAB — CBC
HCT: 39.1 % (ref 36.0–46.0)
Hemoglobin: 12.4 g/dL (ref 12.0–15.0)
MCH: 27.7 pg (ref 26.0–34.0)
MCHC: 31.7 g/dL (ref 30.0–36.0)
MCV: 87.5 fL (ref 80.0–100.0)
Platelets: 423 10*3/uL — ABNORMAL HIGH (ref 150–400)
RBC: 4.47 MIL/uL (ref 3.87–5.11)
RDW: 14.4 % (ref 11.5–15.5)
WBC: 7.5 10*3/uL (ref 4.0–10.5)
nRBC: 0 % (ref 0.0–0.2)

## 2023-05-19 LAB — BASIC METABOLIC PANEL
Anion gap: 11 (ref 5–15)
BUN: 9 mg/dL (ref 6–20)
CO2: 21 mmol/L — ABNORMAL LOW (ref 22–32)
Calcium: 9.7 mg/dL (ref 8.9–10.3)
Chloride: 104 mmol/L (ref 98–111)
Creatinine, Ser: 0.88 mg/dL (ref 0.44–1.00)
GFR, Estimated: >60 mL/min (ref >60)
Glucose, Bld: 106 mg/dL — ABNORMAL HIGH (ref 70–99)
Potassium: 3.5 mmol/L (ref 3.5–5.1)
Sodium: 136 mmol/L (ref 135–145)

## 2023-05-19 LAB — I-STAT CHEM 8, ED
BUN: 9 mg/dL (ref 6–20)
Calcium, Ion: 1.19 mmol/L (ref 1.15–1.40)
Chloride: 105 mmol/L (ref 98–111)
Creatinine, Ser: 0.8 mg/dL (ref 0.44–1.00)
Glucose, Bld: 106 mg/dL — ABNORMAL HIGH (ref 70–99)
HCT: 41 % (ref 36.0–46.0)
Hemoglobin: 13.9 g/dL (ref 12.0–15.0)
Potassium: 3.6 mmol/L (ref 3.5–5.1)
Sodium: 136 mmol/L (ref 135–145)
TCO2: 21 mmol/L — ABNORMAL LOW (ref 22–32)

## 2023-05-19 MED ORDER — LAMOTRIGINE 25 MG PO TABS: 125.0000 mg | ORAL_TABLET | Freq: Every day | ORAL | 0 refills | Status: DC

## 2023-05-19 MED ORDER — LORAZEPAM 2 MG/ML IJ SOLN
1.0000 mg | Freq: Once | INTRAMUSCULAR | Status: AC
  Administered 2023-05-19: 1 mg via INTRAVENOUS
  Filled 2023-05-19: qty 1

## 2023-05-19 MED ORDER — LAMOTRIGINE 25 MG PO TABS: 125.0000 mg | ORAL_TABLET | Freq: Two times a day (BID) | ORAL | 0 refills | Status: DC

## 2023-05-19 MED ORDER — CLONAZEPAM 0.5 MG PO TABS: 0.5000 mg | ORAL_TABLET | Freq: Two times a day (BID) | ORAL | 0 refills | Status: DC

## 2023-05-19 NOTE — ED Notes (Signed)
 PT left before being DC or discussing with provider.  EDP aware.  Dispo papers not provided to pt.
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
## 2023-05-19 NOTE — ED Notes (Signed)
 At approx. 1350 I was about to draw blood on this pt and she had what appeared as an absence seizure.  She was staring ahead without responding to stimuli and was confused after.  EDP's notified.
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
## 2023-05-20 ENCOUNTER — Telehealth: Payer: Self-pay | Admitting: *Deleted

## 2023-05-20 LAB — LAMOTRIGINE LEVEL: Lamotrigine Lvl: 5.5 ug/mL (ref 2.0–20.0)

## 2023-05-20 NOTE — Telephone Encounter (Signed)
 Pharmacy called related to Rx: lamictal sig .Marland KitchenMarland KitchenEDCM clarified with EDP and reviewed the chart.  Advised to change Rx to: "increase lamotrigine to 125mg  BID" as written in EDP note.   Delvonte Berenson J. Lucretia Roers, RN, BSN, NCM  Transitions of Care  Nurse Case Manager  [facility]  Operative Services  [phone]

## 2023-05-21 ENCOUNTER — Encounter: Payer: Self-pay | Admitting: Neurology

## 2023-07-06 ENCOUNTER — Other Ambulatory Visit: Payer: Self-pay

## 2023-07-06 MED ORDER — ZONISAMIDE 100 MG PO CAPS: ORAL_CAPSULE | ORAL | 1 refills | Status: DC

## 2023-08-11 ENCOUNTER — Other Ambulatory Visit: Payer: Self-pay

## 2023-08-11 DIAGNOSIS — G43009 Migraine without aura, not intractable, without status migrainosus: Secondary | ICD-10-CM

## 2023-08-11 MED ORDER — SUMATRIPTAN SUCCINATE 25 MG PO TABS: 25.0000 mg | ORAL_TABLET | Freq: Once | ORAL | 1 refills | Status: AC | PRN

## 2023-08-14 ENCOUNTER — Telehealth: Payer: Self-pay | Admitting: Neurology
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
## 2023-08-14 NOTE — Telephone Encounter (Signed)
 Pt/s mom dropped off DMV Paper work which needs to be complete before Court date 08/18/23 please and thank you, if any issues call Pt DMV paper work inbox

## 2023-08-17 ENCOUNTER — Telehealth: Payer: Self-pay | Admitting: Neurology

## 2023-08-17 DIAGNOSIS — Z0279 Encounter for issue of other medical certificate: Secondary | ICD-10-CM

## 2023-08-17 NOTE — Telephone Encounter (Signed)
 Pt paid for [facility] paperwork, to be faxed to [facility] ([phone]) and Lawyer ([phone]) Note on form inbox

## 2023-08-19 NOTE — Telephone Encounter (Signed)
 We can close this? Thanks!

## 2023-08-21 ENCOUNTER — Ambulatory Visit (INDEPENDENT_AMBULATORY_CARE_PROVIDER_SITE_OTHER): Payer: PRIVATE HEALTH INSURANCE | Admitting: Neurology

## 2023-08-21 ENCOUNTER — Encounter: Payer: Self-pay | Admitting: Neurology

## 2023-08-21 VITALS — BP 111/66 | HR 75 | Ht 63.0 in | Wt 160.4 lb

## 2023-08-21 DIAGNOSIS — G40209 Localization-related (focal) (partial) symptomatic epilepsy and epileptic syndromes with complex partial seizures, not intractable, without status epilepticus: Secondary | ICD-10-CM

## 2023-08-21 DIAGNOSIS — G43009 Migraine without aura, not intractable, without status migrainosus: Secondary | ICD-10-CM | POA: Diagnosis not present

## 2023-08-21 MED ORDER — ZONISAMIDE 100 MG PO CAPS: ORAL_CAPSULE | ORAL | 3 refills | Status: DC

## 2023-08-21 MED ORDER — LAMOTRIGINE 100 MG PO TABS: ORAL_TABLET | ORAL | 3 refills | Status: DC
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
## 2023-12-24 ENCOUNTER — Other Ambulatory Visit

## 2023-12-24 ENCOUNTER — Ambulatory Visit: Admitting: Neurology

## 2023-12-24 ENCOUNTER — Encounter: Payer: Self-pay | Admitting: Neurology

## 2023-12-24 VITALS — BP 99/66 | HR 85 | Resp 18 | Ht 63.0 in | Wt 161.0 lb

## 2023-12-24 DIAGNOSIS — G43009 Migraine without aura, not intractable, without status migrainosus: Secondary | ICD-10-CM

## 2023-12-24 DIAGNOSIS — G40209 Localization-related (focal) (partial) symptomatic epilepsy and epileptic syndromes with complex partial seizures, not intractable, without status epilepticus: Secondary | ICD-10-CM

## 2023-12-24 MED ORDER — ZONISAMIDE 100 MG PO CAPS: ORAL_CAPSULE | ORAL | 3 refills | Status: AC

## 2023-12-24 MED ORDER — LAMOTRIGINE 100 MG PO TABS: ORAL_TABLET | ORAL | 3 refills | Status: AC
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
## 2023-12-28 LAB — ZONISAMIDE LEVEL: Zonisamide: 11.8 ug/mL (ref 10.0–40.0)

## 2023-12-28 LAB — LAMOTRIGINE LEVEL: Lamotrigine Lvl: 3.3 ug/mL (ref 2.5–15.0)

## 2023-12-31 ENCOUNTER — Ambulatory Visit: Payer: Self-pay | Admitting: Neurology

## 2023-12-31 NOTE — Telephone Encounter (Signed)
-----   Message from Darice CHRISTELLA Shivers sent at 12/31/2023  4:56 AM EST ----- Pls let her know bloodwork looks good, continue taking medications regularly. She can drop off DMV so we can fill out for her, thanks ----- Message ----- From: Interface, [facility] Results In Sent: 12/28/2023   7:41 PM EST To: Darice CHRISTELLA Shivers, MD

## 2023-12-31 NOTE — Telephone Encounter (Signed)
 [facility] Call ID: [id number]  Caller: Latrica L : self   PH: [phone]  Reason for call: returning call from the office.

## 2023-12-31 NOTE — Telephone Encounter (Signed)
 Pt called an informed bloodwork looks good, continue taking medications regularly. She can drop off DMV so we can fill out for her

## 2024-03-04 ENCOUNTER — Other Ambulatory Visit: Payer: Self-pay

## 2024-03-04 ENCOUNTER — Emergency Department (HOSPITAL_COMMUNITY)
Admission: EM | Admit: 2024-03-04 | Discharge: 2024-03-05 | Disposition: A | Discharge status: Home / Self Care | Payer: PRIVATE HEALTH INSURANCE | Attending: Emergency Medicine | Admitting: Emergency Medicine

## 2024-03-04 ENCOUNTER — Encounter (HOSPITAL_COMMUNITY): Payer: Self-pay

## 2024-03-04 DIAGNOSIS — R112 Nausea with vomiting, unspecified: Secondary | ICD-10-CM | POA: Diagnosis not present

## 2024-03-04 DIAGNOSIS — R197 Diarrhea, unspecified: Secondary | ICD-10-CM | POA: Diagnosis present

## 2024-03-04 LAB — URINALYSIS, ROUTINE W REFLEX MICROSCOPIC
Bacteria, UA: NONE SEEN
Bilirubin Urine: NEGATIVE
Glucose, UA: NEGATIVE mg/dL
Ketones, ur: 5 mg/dL — AB
Leukocytes,Ua: NEGATIVE
Nitrite: NEGATIVE
Protein, ur: 30 mg/dL — AB
Specific Gravity, Urine: 1.033 — ABNORMAL HIGH (ref 1.005–1.030)
pH: 5 (ref 5.0–8.0)

## 2024-03-04 LAB — COMPREHENSIVE METABOLIC PANEL WITH GFR
ALT: 23 U/L (ref 0–44)
AST: 25 U/L (ref 15–41)
Albumin: 4.6 g/dL (ref 3.5–5.0)
Alkaline Phosphatase: 81 U/L (ref 38–126)
Anion gap: 12 (ref 5–15)
BUN: 16 mg/dL (ref 6–20)
CO2: 20 mmol/L — ABNORMAL LOW (ref 22–32)
Calcium: 9.5 mg/dL (ref 8.9–10.3)
Chloride: 98 mmol/L (ref 98–111)
Creatinine, Ser: 0.9 mg/dL (ref 0.44–1.00)
GFR, Estimated: >60 mL/min
Glucose, Bld: 117 mg/dL — ABNORMAL HIGH (ref 70–99)
Potassium: 3.5 mmol/L (ref 3.5–5.1)
Sodium: 131 mmol/L — ABNORMAL LOW (ref 135–145)
Total Bilirubin: 1.3 mg/dL — ABNORMAL HIGH (ref 0.0–1.2)
Total Protein: 8.2 g/dL — ABNORMAL HIGH (ref 6.5–8.1)

## 2024-03-04 LAB — CBC
HCT: 39.8 % (ref 36.0–46.0)
Hemoglobin: 12.3 g/dL (ref 12.0–15.0)
MCH: 26.3 pg (ref 26.0–34.0)
MCHC: 30.9 g/dL (ref 30.0–36.0)
MCV: 85.2 fL (ref 80.0–100.0)
Platelets: 316 K/uL (ref 150–400)
RBC: 4.67 MIL/uL (ref 3.87–5.11)
RDW: 14.2 % (ref 11.5–15.5)
WBC: 5.9 K/uL (ref 4.0–10.5)
nRBC: 0 % (ref 0.0–0.2)

## 2024-03-04 LAB — LIPASE, BLOOD: Lipase: 18 U/L (ref 11–51)

## 2024-03-04 LAB — PREGNANCY, URINE: Preg Test, Ur: NEGATIVE

## 2024-03-04 NOTE — ED Triage Notes (Signed)
 Nausea, vomiting and diarrhea since this morning.   Suspects she had some bad sausage and has food poisoning.

## 2024-03-05 MED ORDER — LOPERAMIDE HCL 2 MG PO CAPS: 2.0000 mg | ORAL_CAPSULE | Freq: Four times a day (QID) | ORAL | 0 refills | Status: AC | PRN

## 2024-03-05 MED ORDER — ONDANSETRON 4 MG PO TBDP: 4.0000 mg | ORAL_TABLET | Freq: Three times a day (TID) | ORAL | 0 refills | Status: AC | PRN

## 2024-03-05 NOTE — ED Provider Notes (Signed)
 " [place] EMERGENCY DEPARTMENT AT [facility] Provider Note   CSN: [id number] Arrival date & time: 03/04/24  1859     Patient presents with: Diarrhea   Olivia Castillo is a 29 y.o. female.   The history is provided by the patient and medical records.  Diarrhea Associated symptoms: vomiting    29 y.o. F here with N/V/D.  Thinks she ate some bad sausage at breakfast this AM.  She reports a few episodes of nonbloody, nonbilious emesis.  Diarrhea has been loose and watery as well.  She denies any fevers or chills.  Has been able to drink some gatorade and ate some potato chips while in the lobby.  Prior to Admission medications  Medication Sig Start Date End Date Taking? Authorizing Provider  loperamide  (IMODIUM ) 2 MG capsule Take 1 capsule (2 mg total) by mouth 4 (four) times daily as needed for diarrhea or loose stools. 03/05/24  Yes Jarold Olam HERO, PA-C  ondansetron  (ZOFRAN -ODT) 4 MG disintegrating tablet Take 1 tablet (4 mg total) by mouth every 8 (eight) hours as needed. 03/05/24  Yes Jarold Olam HERO, PA-C  lamoTRIgine  (LAMICTAL ) 100 MG tablet Take 1 tablet by mouth twice daily 12/24/23   Georjean Darice HERO, MD  SUMAtriptan  (IMITREX ) 25 MG tablet Take 1 tablet (25 mg total) by mouth once as needed for up to 1 dose for migraine. May repeat in 2 hours if headache persists or recurs. 08/11/23   Georjean Darice HERO, MD  zonisamide  (ZONEGRAN ) 100 MG capsule Take 2 capsules every night 12/24/23   Aquino, Karen M, MD    Allergies: Keppra [levetiracetam], Oseltamivir phosphate, and Oseltamivir    Review of Systems  Gastrointestinal:  Positive for diarrhea, nausea and vomiting.  All other systems reviewed and are negative.   Updated Vital Signs BP 94/69 (BP Location: Left Arm)   Pulse 97   Temp 99.1 F (37.3 C) (Oral)   Resp 18   Ht 5' 3 (1.6 m)   Wt 74.8 kg   LMP 03/04/2024 (Approximate)   SpO2 100%   BMI 29.23 kg/m   Physical Exam Vitals and nursing note reviewed.   Constitutional:      Appearance: She is well-developed.  HENT:     Head: Normocephalic and atraumatic.  Eyes:     Conjunctiva/sclera: Conjunctivae normal.     Pupils: Pupils are equal, round, and reactive to light.  Cardiovascular:     Rate and Rhythm: Normal rate and regular rhythm.     Heart sounds: Normal heart sounds.  Pulmonary:     Effort: Pulmonary effort is normal.     Breath sounds: Normal breath sounds.  Abdominal:     General: Bowel sounds are normal.     Palpations: Abdomen is soft.     Tenderness: There is no abdominal tenderness. There is no rebound.     Comments: Soft, non-tender, no peritoneal signs  Musculoskeletal:        General: Normal range of motion.     Cervical back: Normal range of motion.  Skin:    General: Skin is warm and dry.  Neurological:     Mental Status: She is alert and oriented to person, place, and time.     (all labs ordered are listed, but only abnormal results are displayed) Labs Reviewed  COMPREHENSIVE METABOLIC PANEL WITH GFR - Abnormal; Notable for the following components:      Result Value   Sodium 131 (*)    CO2 20 (*)  Glucose, Bld 117 (*)    Total Protein 8.2 (*)    Total Bilirubin 1.3 (*)    All other components within normal limits  URINALYSIS, ROUTINE W REFLEX MICROSCOPIC - Abnormal; Notable for the following components:   Specific Gravity, Urine 1.033 (*)    Hgb urine dipstick MODERATE (*)    Ketones, ur 5 (*)    Protein, ur 30 (*)    All other components within normal limits  LIPASE, BLOOD  CBC  PREGNANCY, URINE    EKG: None  Radiology: No results found.   Procedures   Medications Ordered in the ED - No data to display                                  Medical Decision Making Amount and/or Complexity of Data Reviewed Labs: ordered. ECG/medicine tests: ordered and independent interpretation performed.  Risk Prescription drug management.   29 year old female presenting to the ED with nausea,  vomiting, and diarrhea after eating some bad sausage this morning.  She is afebrile and nontoxic in appearance here.  She is hemodynamically stable.  Abdomen is soft and nontender without any peritoneal signs.  Labs are obtained and are grossly reassuring.  She has been able to drink Gatorade and ate some potato chips while in the waiting room.  Though she is stable for discharge with continued symptomatic care.  Recommended bland diet for now and advance as tolerated.  Can follow-up with PCP if any ongoing issues.  Can return here for new concerns.  Final diagnoses:  None    ED Discharge Orders          Ordered    ondansetron  (ZOFRAN -ODT) 4 MG disintegrating tablet  Every 8 hours PRN        03/05/24 0055    loperamide  (IMODIUM ) 2 MG capsule  4 times daily PRN        03/05/24 0055               Jarold Olam HERO, PA-C 03/05/24 0106    Trine Raynell Moder, MD 03/05/24 [phone]  "

## 2024-03-05 NOTE — Discharge Instructions (Signed)
 Take the prescribed medication as directed.  Recommend bland diet for now, advance as tolerated. Follow-up with your primary care doctor. Return to the ED for new or worsening symptoms.

## 2024-04-25 ENCOUNTER — Ambulatory Visit: Admitting: Neurology
